# Patient Record
Sex: Female | Born: 1945
Health system: Southern US, Community
[De-identification: ages and names within clinical notes are randomized; demographics above are authoritative.]

## PROBLEM LIST (undated history)

## (undated) DIAGNOSIS — R002 Palpitations: Secondary | ICD-10-CM

## (undated) DIAGNOSIS — H9190 Unspecified hearing loss, unspecified ear: Secondary | ICD-10-CM

## (undated) DIAGNOSIS — R42 Dizziness and giddiness: Secondary | ICD-10-CM

## (undated) DIAGNOSIS — M1990 Unspecified osteoarthritis, unspecified site: Secondary | ICD-10-CM

## (undated) HISTORY — PX: BREAST SURGERY: SHX581

## (undated) HISTORY — DX: Palpitations: R00.2

## (undated) HISTORY — DX: Dizziness and giddiness: R42

## (undated) HISTORY — PX: WRIST SURGERY: SHX841

## (undated) HISTORY — PX: BACK SURGERY: SHX140

## (undated) HISTORY — PX: KNEE ARTHROSCOPY: SUR90

---

## 2001-07-27 ENCOUNTER — Ambulatory Visit (HOSPITAL_COMMUNITY): Admission: RE | Admit: 2001-07-27 | Discharge: 2001-07-27 | Payer: Self-pay | Admitting: Pulmonary Disease

## 2001-08-03 ENCOUNTER — Encounter: Admission: RE | Admit: 2001-08-03 | Discharge: 2001-08-03 | Payer: Self-pay | Admitting: Oncology

## 2001-08-03 ENCOUNTER — Encounter (HOSPITAL_COMMUNITY): Admission: RE | Admit: 2001-08-03 | Discharge: 2001-09-02 | Payer: Self-pay | Admitting: Oncology

## 2002-04-20 ENCOUNTER — Encounter: Admission: RE | Admit: 2002-04-20 | Discharge: 2002-04-20 | Payer: Self-pay | Admitting: Oncology

## 2002-04-20 ENCOUNTER — Encounter (HOSPITAL_COMMUNITY): Admission: RE | Admit: 2002-04-20 | Discharge: 2002-05-20 | Payer: Self-pay | Admitting: Oncology

## 2003-04-21 ENCOUNTER — Encounter (HOSPITAL_COMMUNITY): Admission: RE | Admit: 2003-04-21 | Discharge: 2003-05-21 | Payer: Self-pay | Admitting: Oncology

## 2003-04-21 ENCOUNTER — Encounter: Admission: RE | Admit: 2003-04-21 | Discharge: 2003-04-21 | Payer: Self-pay | Admitting: Oncology

## 2004-05-07 ENCOUNTER — Encounter: Admission: RE | Admit: 2004-05-07 | Discharge: 2004-05-07 | Payer: Self-pay | Admitting: Oncology

## 2004-05-07 ENCOUNTER — Encounter (HOSPITAL_COMMUNITY): Admission: RE | Admit: 2004-05-07 | Discharge: 2004-06-06 | Payer: Self-pay | Admitting: Oncology

## 2005-06-21 ENCOUNTER — Encounter (HOSPITAL_COMMUNITY): Admission: RE | Admit: 2005-06-21 | Discharge: 2005-07-21 | Payer: Self-pay | Admitting: Oncology

## 2005-06-21 ENCOUNTER — Ambulatory Visit (HOSPITAL_COMMUNITY): Payer: Self-pay | Admitting: Oncology

## 2005-06-21 ENCOUNTER — Encounter: Admission: RE | Admit: 2005-06-21 | Discharge: 2005-06-21 | Payer: Self-pay | Admitting: Oncology

## 2006-11-13 ENCOUNTER — Ambulatory Visit (HOSPITAL_COMMUNITY): Admission: RE | Admit: 2006-11-13 | Discharge: 2006-11-13 | Payer: Self-pay | Admitting: Internal Medicine

## 2006-11-13 ENCOUNTER — Ambulatory Visit: Payer: Self-pay | Admitting: Internal Medicine

## 2009-02-16 HISTORY — PX: EYE SURGERY: SHX253

## 2009-03-16 ENCOUNTER — Ambulatory Visit (HOSPITAL_COMMUNITY): Admission: RE | Admit: 2009-03-16 | Discharge: 2009-03-16 | Payer: Self-pay | Admitting: Ophthalmology

## 2010-01-17 ENCOUNTER — Ambulatory Visit (HOSPITAL_COMMUNITY): Admission: RE | Admit: 2010-01-17 | Discharge: 2010-01-17 | Payer: Self-pay | Admitting: Orthopedic Surgery

## 2010-02-08 ENCOUNTER — Ambulatory Visit (HOSPITAL_COMMUNITY): Admission: RE | Admit: 2010-02-08 | Discharge: 2010-02-08 | Payer: Self-pay | Admitting: Pulmonary Disease

## 2010-03-05 ENCOUNTER — Ambulatory Visit: Payer: Self-pay | Admitting: Cardiovascular Disease

## 2010-03-05 DIAGNOSIS — R0602 Shortness of breath: Secondary | ICD-10-CM

## 2010-03-05 DIAGNOSIS — R002 Palpitations: Secondary | ICD-10-CM

## 2010-03-05 DIAGNOSIS — R9431 Abnormal electrocardiogram [ECG] [EKG]: Secondary | ICD-10-CM

## 2010-03-05 DIAGNOSIS — R42 Dizziness and giddiness: Secondary | ICD-10-CM | POA: Insufficient documentation

## 2010-03-06 ENCOUNTER — Encounter: Payer: Self-pay | Admitting: Cardiovascular Disease

## 2010-03-22 ENCOUNTER — Ambulatory Visit: Payer: Self-pay | Admitting: Cardiology

## 2010-03-22 ENCOUNTER — Ambulatory Visit (HOSPITAL_COMMUNITY): Admission: RE | Admit: 2010-03-22 | Discharge: 2010-03-22 | Payer: Self-pay | Admitting: Cardiovascular Disease

## 2010-03-22 ENCOUNTER — Encounter: Payer: Self-pay | Admitting: Cardiovascular Disease

## 2010-03-26 ENCOUNTER — Encounter: Payer: Self-pay | Admitting: Cardiovascular Disease

## 2010-03-27 ENCOUNTER — Encounter: Payer: Self-pay | Admitting: Cardiovascular Disease

## 2010-12-18 NOTE — Letter (Signed)
Summary: chest xray 02-08-10  chest xray 02-08-10   Imported By: Faythe Ghee 03/06/2010 11:58:19  _____________________________________________________________________  External Attachment:    Type:   Image     Comment:   External Document

## 2010-12-18 NOTE — Letter (Signed)
Summary: progress notes dr Juanetta Gosling  progress notes dr Juanetta Gosling   Imported By: Faythe Ghee 03/06/2010 11:57:04  _____________________________________________________________________  External Attachment:    Type:   Image     Comment:   External Document

## 2010-12-18 NOTE — Letter (Signed)
Summary: labs dr Juanetta Gosling  labs dr Juanetta Gosling   Imported By: Faythe Ghee 03/06/2010 11:57:26  _____________________________________________________________________  External Attachment:    Type:   Image     Comment:   External Document

## 2010-12-18 NOTE — Letter (Signed)
Summary: [place] Results Engineer, agricultural at [facility]  [address] [place] [address]   Phone: [phone]  Fax: [phone]      Mar 27, 2010 MRN: [id number]   Vickie Love [address] [place], [place]  [address]   Dear [name],  Your test ordered by Selena Batten has been reviewed by your physician (or physician assistant) and was found to be normal or stable. Your physician (or physician assistant) felt no changes were needed at this time.  ____ Echocardiogram  __X__ Cardiac Stress Test  ____ Lab Work  ____ Peripheral vascular study of arms, legs or neck  ____ CT scan or X-ray  ____ Lung or Breathing test  ____ Other: Please continue on current medical treatment.   Thank you.   Charlton Haws, MD, F.A.C.C

## 2010-12-18 NOTE — Letter (Signed)
Summary: Stress Echocardiogram Information Sheet  [facility]  [address], [place] [address]   Phone: [phone]  Fax: [phone]      March 05, 2010 MRN: [id number] light prior to the test.   Vickie Love  Doctor: Appointment Date: Appointment Time: Appointment Location: [facility]  Stress Echocardiogram Information Sheet    Instructions:   1. DO NOT  take your medicine  days before the test. (it is ok to take medications that morning).  2. Do not eat after midnight the night before Stress Echo is performed.  3. Dress prepared to exercise.  4. DO NOT use ANY caffine or tobacco products 3 hours before appointment.  5. Report to the Short Stay Center on the1st floor.  6. Please bring all current prescription medications.  7. If you have any questions, please call [phone]

## 2010-12-18 NOTE — Assessment & Plan Note (Signed)
Summary: **NP3 DIZZYNESS FAM HX OF STROKE AND HEART DISEASE   Visit Type:  Initial Consult Primary Provider:  Nehemiah Settle  CC:  occasional dizziness.  History of Present Illness: Vickie Love is referred by Dr Juanetta Gosling for exertional dyspnea, abnormal ECG and dizzyness.  She is a healthy female in general and still works at [employer].  He dyspnea is mild with walking up hills but in general she is very active with no cough, sputum, wheezing or SSCP.  She has had chronic bouts of dizzyness and recent CT showed a benign meningioma.  These episodes are improved and not associated with diaphoresis, nausea, vohmiting, migraines or palpitaiotns.  Her ECG shows a RBBB but no previous M.I. She is concerned about her heart since she hopes to retire in a year and "enjoy" herself  Current Problems (verified): 1)  Dyspnea  (ICD-786.05) 2)  Palpitations  (ICD-785.1) 3)  Dizziness  (ICD-780.4)  Current Medications (verified): 1)  Restoril 30 Mg Caps (Temazepam) .... Take 1 Tab Qhs  Allergies (verified): No Known Drug Allergies  Past History:  Past Medical History: Last updated: 02/26/2010 Current Problems:  PALPITATIONS (ICD-785.1) DIZZINESS (ICD-780.4)  Family History: Last updated: 02/26/2010 Father:hx of stroke  Social History: Married Works for [employer] 3 daughters Nonsmoker Nondrinker  Review of Systems       Denies fever, malais, weight loss, blurry vision, decreased visual acuity, cough, sputum, , hemoptysis, pleuritic pain, palpitaitons, heartburn, abdominal pain, melena, lower extremity edema, claudication, or rash.   Vital Signs:  Patient profile:   65 year old female Height:      66 inches Weight:      134 pounds BMI:     21.71 Pulse rate:   81 / minute BP sitting:   133 / 81  (right arm)  Vitals Entered By: Dreama Saa, CNA (March 05, 2010 10:09 AM)  Physical Exam  General:  Affect appropriate Healthy:  appears stated age HEENT: normal Neck supple with no  adenopathy JVP normal no bruits no thyromegaly Lungs clear with no wheezing and good diaphragmatic motion Heart:  S1/S2 no murmur,rub, gallop or click PMI normal Abdomen: benighn, BS positve, no tenderness, no AAA no bruit.  No HSM or HJR Distal pulses intact with no bruits No edema Neuro non-focal Skin warm and dry    Impression & Recommendations:  Problem # 1:  DYSPNEA (ICD-786.05) Normal exam.  Stress echo Orders: Stress Echo (Stress Echo)  Problem # 2:  DIZZINESS (ICD-780.4) Non cardiac  F/U Hawkins  ? vertigo Orders: Stress Echo (Stress Echo)  Problem # 3:  ELECTROCARDIOGRAM, ABNORMAL (ICD-794.31) Benign ICRBBB.  F/U stress echo  Patient Instructions: 1)  Your physician recommends that you schedule a follow-up appointment in: as needed  2)  Your physician recommends that you continue on your current medications as directed. Please refer to the Current Medication list given to you today. 3)  Your physician has requested that you have a stress echocardiogram. For further information please visit [email or web address]  Please follow instruction sheet as given.   EKG Report  Procedure date:  03/05/2010  Findings:      NSR 71 ICRBBB Borderline ECG

## 2010-12-18 NOTE — Letter (Signed)
Summary: ct head 3.2.11  ct head 3.2.11   Imported By: Faythe Ghee 03/06/2010 11:57:50  _____________________________________________________________________  External Attachment:    Type:   Image     Comment:   External Document

## 2010-12-18 NOTE — Letter (Signed)
Summary: [place] Results Engineer, agricultural at [facility]  [address], [place] [address]   Phone: [phone]  Fax: [phone]      Mar 26, 2010 MRN: [id number]   DHWANI VENKATESH [address] [place], [place]  [address]   Dear Ms. Weatherwax,  Your test ordered by Selena Batten has been reviewed by your physician (or physician assistant) and was found to be normal or stable. Your physician (or physician assistant) felt no changes were needed at this time.  ____ Echocardiogram  ____ Cardiac Stress Test  ____ Lab Work  ____ Peripheral vascular study of arms, legs or neck  ____ CT scan or X-ray  ____ Lung or Breathing test  __X__ Other: Stress Echo Please continue on current medical treatment.  Thank you.   Charlton Haws, MD, F.A.C.C

## 2011-01-11 ENCOUNTER — Other Ambulatory Visit (HOSPITAL_COMMUNITY): Payer: Self-pay | Admitting: Pulmonary Disease

## 2011-01-11 DIAGNOSIS — D1800 Hemangioma unspecified site: Secondary | ICD-10-CM

## 2011-01-14 ENCOUNTER — Ambulatory Visit (HOSPITAL_COMMUNITY)
Admission: RE | Admit: 2011-01-14 | Discharge: 2011-01-14 | Payer: 59 | Source: Ambulatory Visit | Attending: Pulmonary Disease | Admitting: Pulmonary Disease

## 2011-01-23 ENCOUNTER — Other Ambulatory Visit (HOSPITAL_COMMUNITY): Payer: Self-pay | Admitting: Pulmonary Disease

## 2011-01-23 DIAGNOSIS — D1800 Hemangioma unspecified site: Secondary | ICD-10-CM

## 2011-01-28 ENCOUNTER — Ambulatory Visit (HOSPITAL_COMMUNITY)
Admission: RE | Admit: 2011-01-28 | Discharge: 2011-01-28 | Disposition: A | Discharge status: Home / Self Care | Payer: 59 | Source: Ambulatory Visit | Attending: Pulmonary Disease | Admitting: Pulmonary Disease

## 2011-01-28 ENCOUNTER — Encounter (HOSPITAL_COMMUNITY): Payer: Self-pay

## 2011-01-28 DIAGNOSIS — R930 Abnormal findings on diagnostic imaging of skull and head, not elsewhere classified: Secondary | ICD-10-CM | POA: Insufficient documentation

## 2011-01-28 DIAGNOSIS — M899 Disorder of bone, unspecified: Secondary | ICD-10-CM | POA: Insufficient documentation

## 2011-01-28 DIAGNOSIS — D1800 Hemangioma unspecified site: Secondary | ICD-10-CM

## 2011-02-27 LAB — HEMOGLOBIN AND HEMATOCRIT, BLOOD: Hemoglobin: 14.3 g/dL (ref 12.0–15.0)

## 2011-02-27 LAB — BASIC METABOLIC PANEL
Calcium: 9.8 mg/dL (ref 8.4–10.5)
GFR calc Af Amer: >60 mL/min (ref >60)
GFR calc non Af Amer: >60 mL/min (ref >60)
Sodium: 140 mEq/L (ref 135–145)

## 2011-04-05 NOTE — Op Note (Signed)
NAMELIVIAN, Vickie Love                  ACCOUNT NO.:  [id number]   MEDICAL RECORD NO.:  [id number]          PATIENT TYPE:  AMB   LOCATION:  DAY                           FACILITY:  [facility]   PHYSICIAN:  R. Roetta Sessions, M.D. DATE OF BIRTH:  Apr 14, 1946   DATE OF PROCEDURE:  11/13/2006  DATE OF DISCHARGE:                               OPERATIVE REPORT   PROCEDURE PERFORMED:  Screening colonoscopy.   INDICATIONS FOR PROCEDURE:  The patient is a  65 year old lady referred  courtesy of [name], M.D. for colorectal cancer screening.  She  is devoid of any lower GI tract symptoms.  She had a sigmoidoscopy,  unremarkable sigmoidoscopy in 1998 and there is no family history of any  first degree relatives with colorectal cancer.  Colonoscopy is now being  done as a screening maneuver.  This approach has been discussed with the  patient at length.  Potential risks, benefits and alternatives have been  reviewed, questions have been answered.  Please see documentation in the  medical records.   PROCEDURE NOTE:  Oxygen saturations, blood pressure, pulse and  respirations were monitored throughout the entire procedure.   CONSCIOUS SEDATION:  Versed 4 mg IV, Demerol 75 mg IV in divided doses.   INSTRUMENT USED:  Pentax video chip system.   FINDINGS:  Digital exam revealed no abnormalities.   ENDOSCOPIC FINDINGS:  Prep was good.  Colonoscope was advanced from the  rectosigmoid junction to the left, transverse and right colon to the  area of the appendiceal orifice and ileocecal valve and cecum.  These  structures were well seen and photographed for the record.  From this  level, the scope was slowly and cautiously withdrawn.  All previously  mentioned mucosal surfaces were again seen.  The colonic mucosa appeared  normal.  The scope was pulled down into the rectum where a thorough  examination of the rectal mucosa including retroflexion of the anal  verge was undertaken.  The rectal mucosa  appeared normal.  The patient  tolerated the procedure well, was reacted in endoscopy.   IMPRESSION:  Normal rectum, normal colon.   RECOMMENDATIONS:  Repeat colonoscopy in 10 years.      Jonathon Bellows, M.D.  Electronically Signed     RMR/MEDQ  D:  11/13/2006  T:  11/13/2006  Job:  [id number]   cc:   Ramon Dredge L. Juanetta Gosling, M.D.  Fax: [phone]   [name]  Fax: [phone]

## 2011-04-23 IMAGING — CT CT HEAD W/O CM
1 series · 16 of 30 positions shown, 20 images · non-contrast
Comparison: 01/17/2010

CLINICAL DATA: Follow-up occipital skull lesion

CT HEAD WITHOUT CONTRAST
TECHNIQUE: Contiguous axial images were obtained from the base of
the skull through the vertex without contrast.

[Series 2: headseq 4.8 h37s · axial · 0.43mm/px · z∈[+151,+309]mm · 16 of 36 slices shown, 20 images]
[im 2/36  brain]
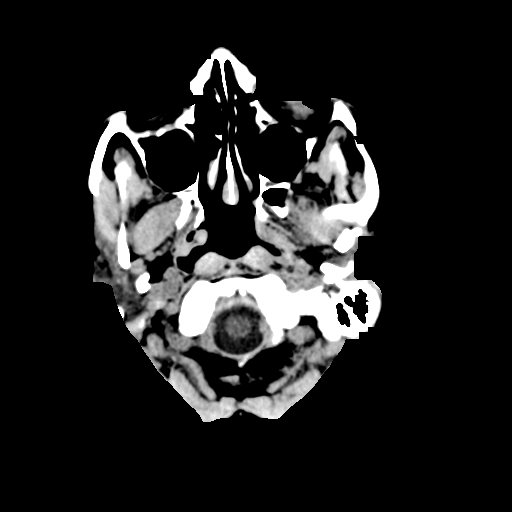
[im 2/36  bone]
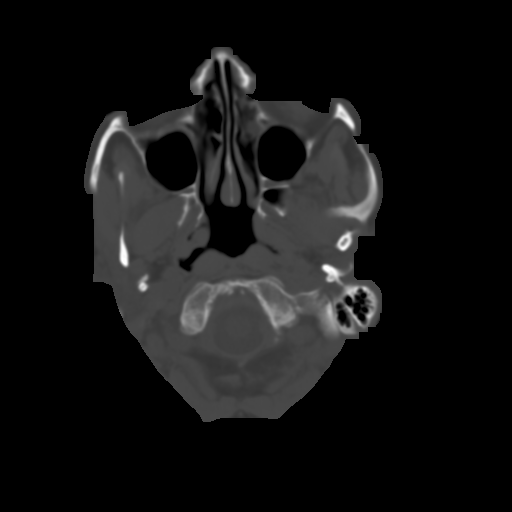
[im 4/36  brain]
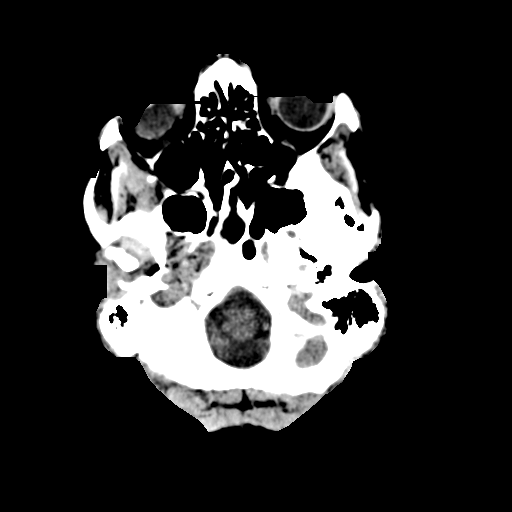
[im 7/36  brain]
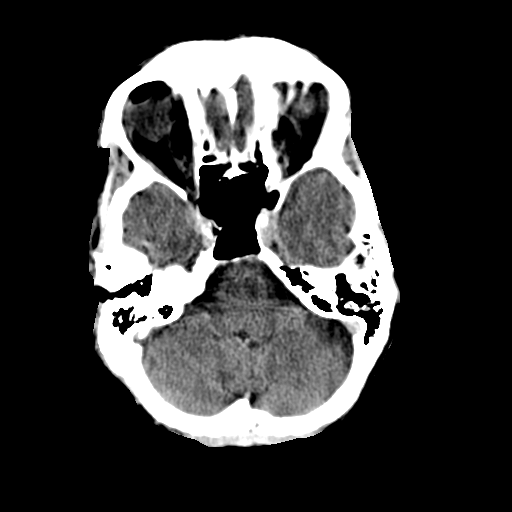
[im 9/36  brain]
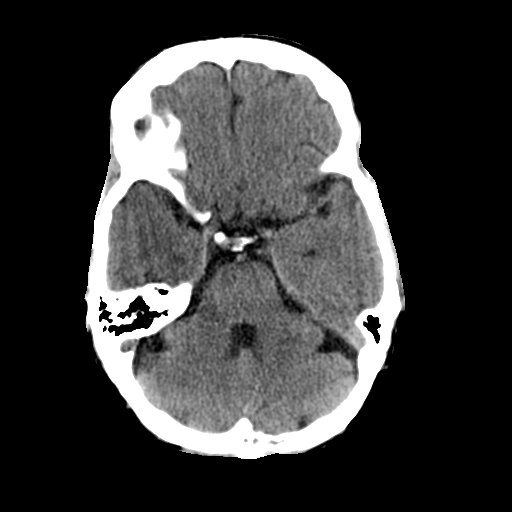
[im 10/36  brain]
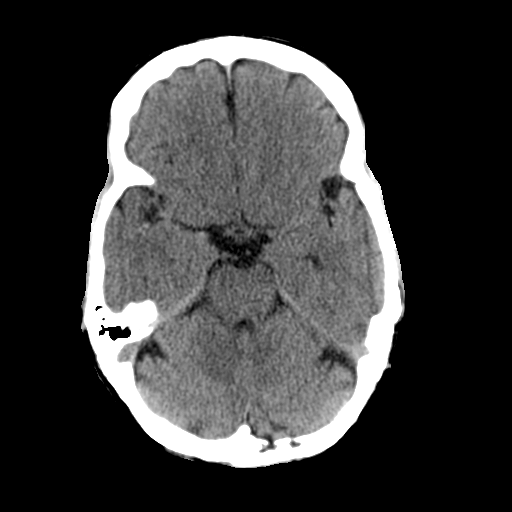
[im 10/36  bone]
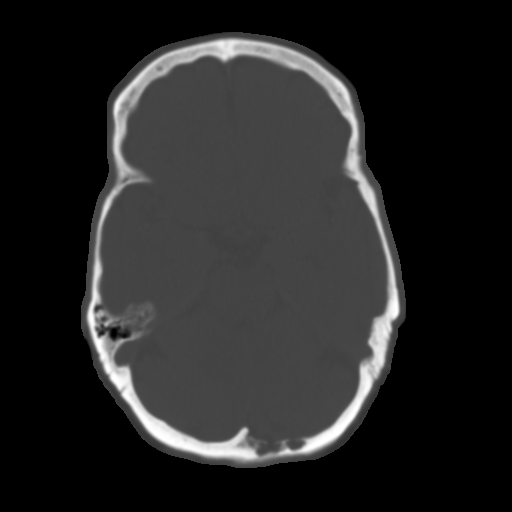
[im 13/36  brain]
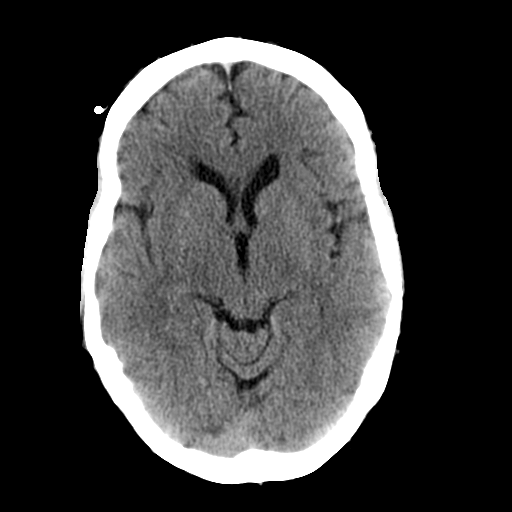
[im 15/36  brain]
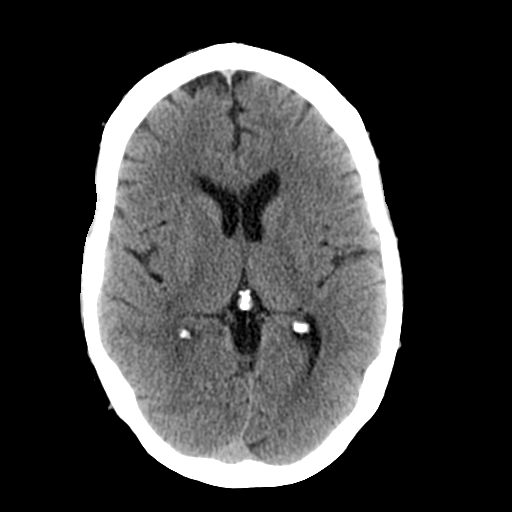
[im 17/36  brain]
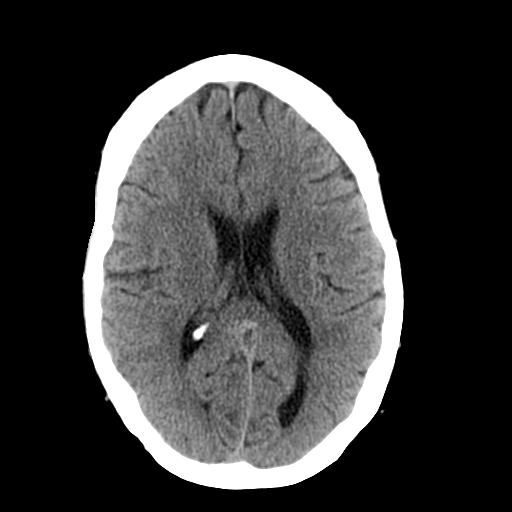
[im 19/36  brain]
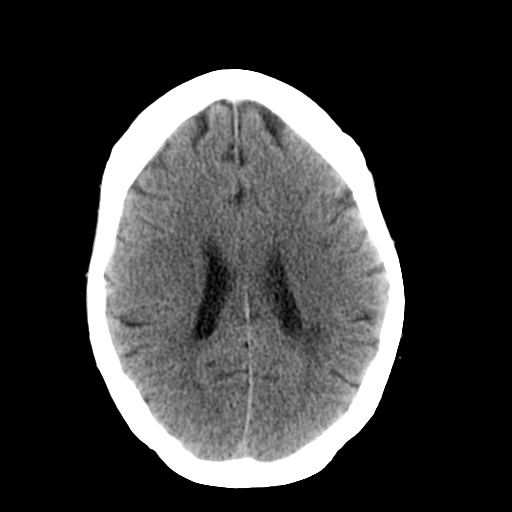
[im 19/36  bone]
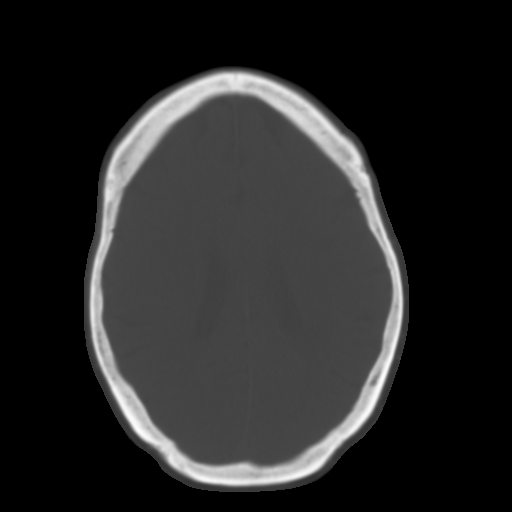
[im 21/36  brain]
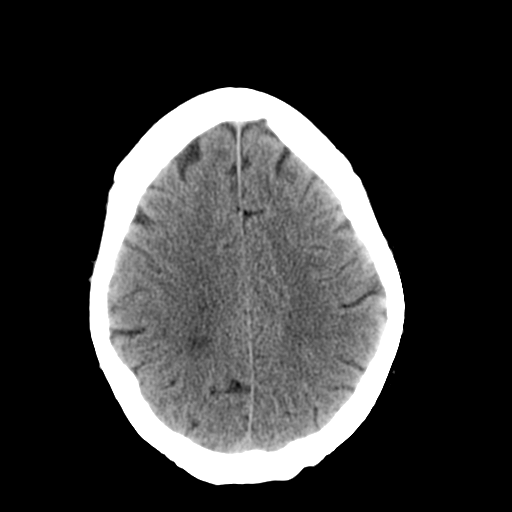
[im 23/36  brain]
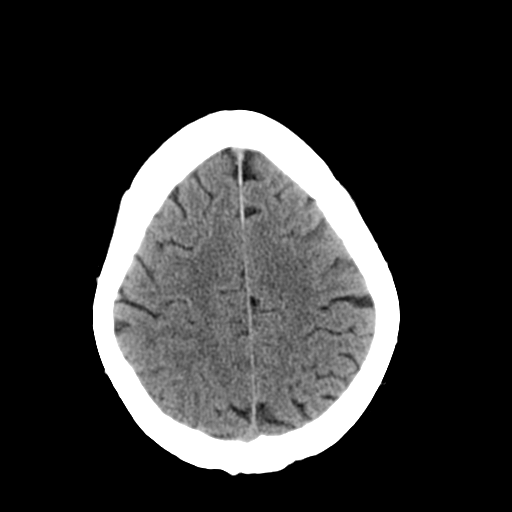
[im 26/36  brain]
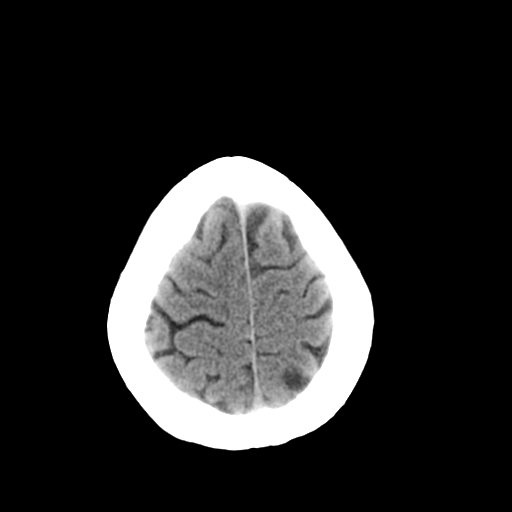
[im 27/36  brain]
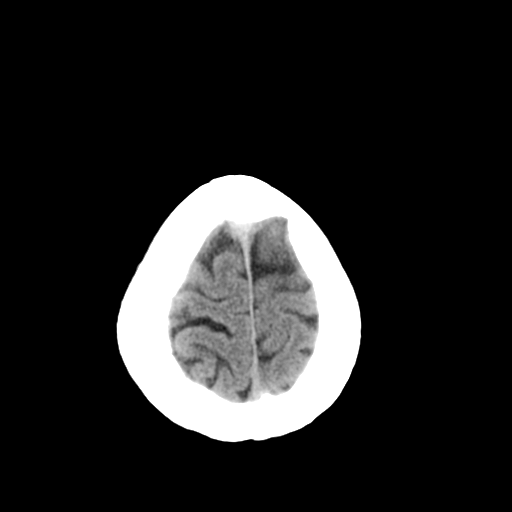
[im 27/36  bone]
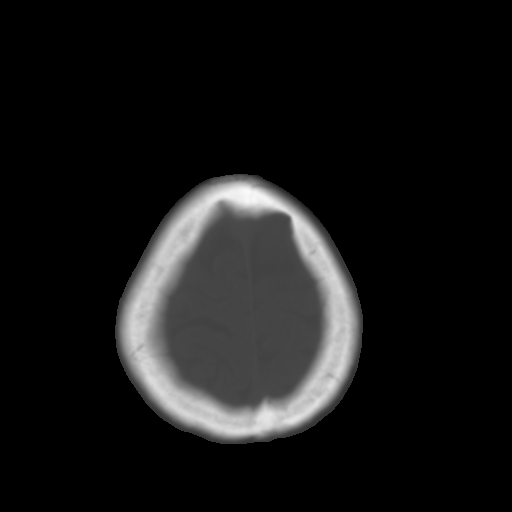
[im 29/36  brain]
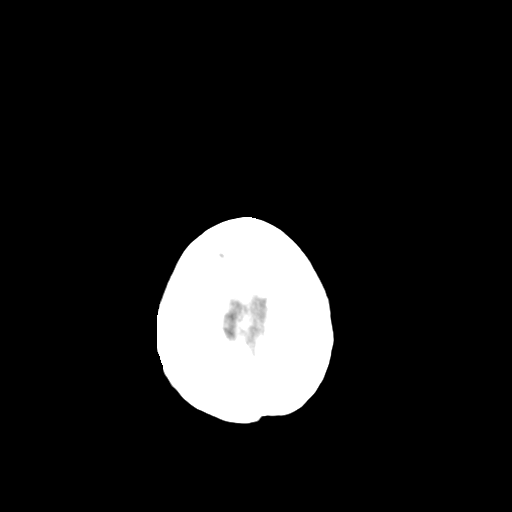
[im 32/36  brain]
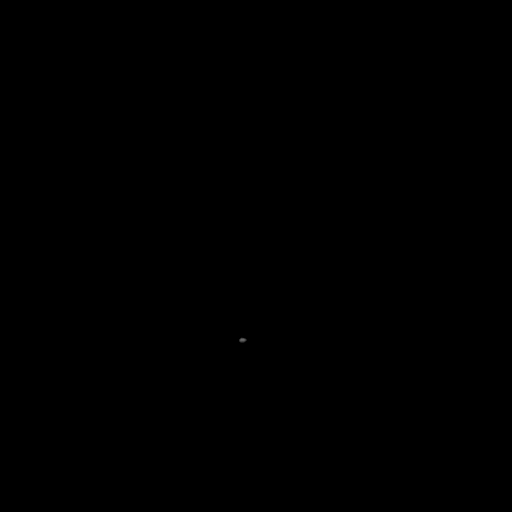
[im 34/36  brain]
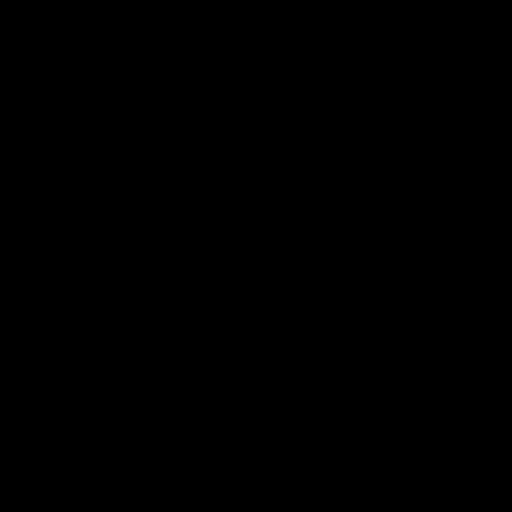

[16 of 30 positions shown; findings below may reference images not displayed]

FINDINGS: Ventricular size and CSF spaces remain normal.  Mild
chronic microvascular changes again noted.  No evidence for acute
infarct, hemorrhage, or mass lesion. No extra-axial fluid
collections or midline shift.  Calvarium intact.  No fluid in the
sinuses visualized.

The lytic lesion in the left occipital bone is unchanged in size or
contour compared to the study 1 year ago.  It is therefore highly
likely that this is a benign lesion of bone such as a hemangioma.
There are no new findings.
IMPRESSION: 1.  Lytic lesion in the left occipital bone stable over a period of
1 year.  Highly likely to be benign.
2.  No acute or focal intracranial abnormality.  Again noted are
mild chronic microvascular white matter changes.
3.  No new findings.

## 2011-07-16 ENCOUNTER — Ambulatory Visit (HOSPITAL_COMMUNITY)
Admission: RE | Admit: 2011-07-16 | Discharge: 2011-07-16 | Disposition: A | Discharge status: Home / Self Care | Payer: 59 | Source: Ambulatory Visit | Attending: Pulmonary Disease | Admitting: Pulmonary Disease

## 2011-07-16 ENCOUNTER — Other Ambulatory Visit (HOSPITAL_COMMUNITY): Payer: Self-pay | Admitting: Pulmonary Disease

## 2011-07-16 DIAGNOSIS — R05 Cough: Secondary | ICD-10-CM

## 2011-07-16 DIAGNOSIS — J40 Bronchitis, not specified as acute or chronic: Secondary | ICD-10-CM | POA: Insufficient documentation

## 2011-07-16 DIAGNOSIS — R059 Cough, unspecified: Secondary | ICD-10-CM | POA: Insufficient documentation

## 2011-10-18 ENCOUNTER — Encounter (HOSPITAL_COMMUNITY): Payer: Self-pay | Admitting: Pharmacy Technician

## 2011-10-21 NOTE — Patient Instructions (Signed)
20 Vickie Love  10/21/2011   Your procedure is scheduled on:  10/28/2011  Report to [facility] at 10:00 AM.  Call this number if you have problems the morning of surgery: [phone]   Remember:   Do not eat food:After Midnight.  May have clear liquids:until Midnight .  Clear liquids include soda, tea, black coffee, apple or grape juice, broth.  Take these medicines the morning of surgery with A SIP OF WATER:    Do not wear jewelry, make-up or nail polish.  Do not wear lotions, powders, or perfumes. You may wear deodorant.  Do not shave 48 hours prior to surgery.  Do not bring valuables to the hospital.  Contacts, dentures or bridgework may not be worn into surgery.  Leave suitcase in the car. After surgery it may be brought to your room.  For patients admitted to the hospital, checkout time is 11:00 AM the day of discharge.   Patients discharged the day of surgery will not be allowed to drive home.  Name and phone number of your driver:   Special Instructions: N/A   Please read over the following fact sheets that you were given: Anesthesia Post-op Instructions     Cataract A cataract is a clouding of the lens of the eye. It is most often related to aging. A cataract is not a "film" over the surface of the eye. The lens is inside the eye and changes size of the pupil. The lens can enlarge to let more light enter the eye in dark environments and contract the size of the pupil to let in bright light. The lens is the part of the eye that helps focus light on the retina. The retina is the eye's light-sensitive layer. It is in the back of the eye that sends visual signals to the brain. In a normal eye, light passes through the lens and gets focused on the retina. To help produce a sharp image, the lens must remain clear. When a lens becomes cloudy, vision is compromised by the degree and nature of the clouding. Certain cataracts make people more near-sighted as they develop, others increase  glare, and all reduce vision to some degree or another. A cataract that is so dense that it becomes milky Vickie Love and a Vickie Love opacity can be seen through the pupil. When the Vickie Love color is seen, it is called a "mature" or "hyper-mature cataract." Such cataracts cause total blindness in the affected eye. The cataract must be removed to prevent damage to the eye itself. Some types of cataracts can cause a secondary disease of the eye, such as certain types of glaucoma. In the early stages, better lighting and eyeglasses may lessen vision problems caused by cataracts. At a certain point, surgery may be needed to improve vision. CAUSES   Aging. However, cataracts may occur at any age, even in newborns.   Certain drugs.   Trauma to the eye.   Certain diseases (such as diabetes).   Inherited or acquired medical syndromes.  SYMPTOMS   Gradual, progressive drop in vision in the affected eye. Cataracts may develop at different rates in each eye. Cataracts may even be in just one eye with the other unaffected.   Cataracts due to trauma may develop quickly, sometimes over a matter or days or even hours. The result is severe and rapid visual loss.  DIAGNOSIS  To detect a cataract, an eye doctor examines the lens. A well developed cataract can be diagnosed without dilating the  pupil. Early cataracts and others of a specific nature are best diagnosed with an exam of the eyes with the pupils dilated by drops. TREATMENT   For an early cataract, vision may improve by using different eyeglasses or stronger lighting.   If the above measures do not help, surgery is the only effective treatment. This treatment removes the cloudy lens and replaces it with a substitute lens (Intraocular lens, or IOL). Newly developed IOL technology allows the implanted lens to improve vision both at a distance and up close. Discuss with your eye surgeon about the possibility of still needing glasses. Also discuss how visual  coordination between both eyes will be affected.  A cataract needs to be removed only when vision loss interferes with your everyday activities such as driving, reading or watching TV. You and your eye doctor can make that decision together. In most cases, waiting until you are ready to have cataract surgery will not harm your eye. If you have cataracts in both eyes, only one should be removed at a time. This allows the operated eye to heal and be out of danger from serious problems (such as infection or poor wound healing) before having the other eye undergo surgery.  Sometimes, a cataract should be removed even if it does not cause problems with your vision. For example, a cataract should be removed if it prevents examination or treatment of another eye problem. Just as you cannot see out of the affected eye well, your doctor cannot see into your eye well through a cataract. The vast majority of people who have cataract surgery have better vision afterward. CATARACT REMOVAL There are two primary ways to remove a cataract. Your doctor can explain the differences and help determine which is best for you:  Phacoemulsification (small incision cataract surgery). This involves making a small cut (incision) on the edge of the clear, dome-shaped surface that covers the front of the eye (the cornea). An injection behind the eye or eye drops are given to make this a painless procedure. The doctor then inserts a tiny probe into the eye. This device emits ultrasound waves that soften and break up the cloudy center of the lens so it can be removed by suction. Most cataract surgery is done this way. The cuts are usually so small and performed in such a manner that often no sutures are needed to keep it closed.   Extracapsular surgery. Your doctor makes a slightly longer incision on the side of the cornea. The doctor removes the hard center of the lens. The remainder of the lens is then removed by suction. In some  cases, extremely fine sutures are needed which the doctor may, or may not remove in the office after the surgery.  When an IOL is implanted, it needs no care. It becomes a permanent part of your eye and cannot be seen or felt.  Some people cannot have an IOL. They may have problems during surgery, or maybe they have another eye disease. For these people, a soft contact lens may be suggested. If an IOL or contact lens cannot be used, very powerful and thick glasses are required after surgery. Since vision is very different through such thick glasses, it is important to have your doctor discuss the impact on your vision after any cataract surgery where there is no plan to implant an IOL. The normal lens of the eye is covered by a clear capsule. Both phacoemulsification and extracapsular surgery require that the back surface of  this lens capsule be left in place. This helps support IOLs and prevents the IOL from dislocating and falling back into the deeper interior of the eye. Right after surgery, and often permanently this "posterior capsule" remains clear. In some cases however, it can become cloudy, presenting the same type of visual compromise that the original cataract did since light is again obstructed as it passes through the clear IOL. This condition is often referred to as an "after-cataract." Fortunately, after-cataracts are easily treated using a painless and very fast laser treatment that is performed without anesthesia or incisions. It is done in a matter of minutes in an outpatient environment. Visual improvement is often immediate.  HOME CARE INSTRUCTIONS   Your surgeon will discuss pre and post operative care with you prior to surgery. The majority of people are able to do almost all normal activities right away. Although, it is often advised to avoid strenuous activity for a period of time.   Postoperative drops and careful avoidance of infection will be needed. Many surgeons suggest the use  of a protective shield during the first few days after surgery.   There is a very small incidence of complication from modern cataract surgery, but it can happen. Infection that spreads to the inside of the eye (endophthalmitis) can result in total visual loss and even loss of the eye itself. In extremely rare instances, the inflammation of endophthalmitis can spread to both eyes (sympathetic ophthalmia). Appropriate post-operative care under the close observation of your surgeon is essential to a successful outcome.  SEEK IMMEDIATE MEDICAL CARE IF:   You have any sudden drop of vision in the operated eye.   You have pain in the operated eye.   You see a large number of floating dots in the field of vision in the operated eye.   You see flashing lights, or if a portion of your side vision in any direction appears black (like a curtain being drawn into your field of vision) in the operated eye.  Document Released: 11/04/2005 Document Revised: 07/17/2011 Document Reviewed: 12/21/2007 [facility] Patient Information 2012 [place], [place].   PATIENT INSTRUCTIONS POST-ANESTHESIA  IMMEDIATELY FOLLOWING SURGERY:  Do not drive or operate machinery for the first twenty four hours after surgery.  Do not make any important decisions for twenty four hours after surgery or while taking narcotic pain medications or sedatives.  If you develop intractable nausea and vomiting or a severe headache please notify your doctor immediately.  FOLLOW-UP:  Please make an appointment with your surgeon as instructed. You do not need to follow up with anesthesia unless specifically instructed to do so.  WOUND CARE INSTRUCTIONS (if applicable):  Keep a dry clean dressing on the anesthesia/puncture wound site if there is drainage.  Once the wound has quit draining you may leave it open to air.  Generally you should leave the bandage intact for twenty four hours unless there is drainage.  If the epidural site drains for more  than 36-48 hours please call the anesthesia department.  QUESTIONS?:  Please feel free to call your physician or the hospital operator if you have any questions, and they will be happy to assist you.     [facility] Anesthesia Department [address] [place] [phone]

## 2011-10-22 ENCOUNTER — Encounter (HOSPITAL_COMMUNITY): Payer: Self-pay

## 2011-10-22 ENCOUNTER — Other Ambulatory Visit: Payer: Self-pay

## 2011-10-22 ENCOUNTER — Encounter (HOSPITAL_COMMUNITY)
Admission: RE | Admit: 2011-10-22 | Discharge: 2011-10-22 | Disposition: A | Discharge status: Home / Self Care | Payer: 59 | Source: Ambulatory Visit | Attending: Ophthalmology | Admitting: Ophthalmology

## 2011-10-22 HISTORY — DX: Unspecified osteoarthritis, unspecified site: M19.90

## 2011-10-22 HISTORY — DX: Unspecified hearing loss, unspecified ear: H91.90

## 2011-10-22 LAB — CBC
HCT: 39.6 % (ref 36.0–46.0)
Hemoglobin: 13.3 g/dL (ref 12.0–15.0)
MCH: 30.9 pg (ref 26.0–34.0)
MCHC: 33.6 g/dL (ref 30.0–36.0)
MCV: 91.9 fL (ref 78.0–100.0)
Platelets: 157 10*3/uL (ref 150–400)
RBC: 4.31 MIL/uL (ref 3.87–5.11)
RDW: 12.8 % (ref 11.5–15.5)
WBC: 4 10*3/uL (ref 4.0–10.5)

## 2011-10-22 LAB — BASIC METABOLIC PANEL
Calcium: 9.8 mg/dL (ref 8.4–10.5)
Chloride: 102 mEq/L (ref 96–112)
GFR calc Af Amer: >90 mL/min (ref >90)
Glucose, Bld: 103 mg/dL — ABNORMAL HIGH (ref 70–99)
Sodium: 140 mEq/L (ref 135–145)

## 2011-10-28 ENCOUNTER — Encounter (HOSPITAL_COMMUNITY)
Admission: RE | Disposition: A | Discharge status: Home / Self Care | Payer: Self-pay | Source: Ambulatory Visit | Attending: Ophthalmology

## 2011-10-28 ENCOUNTER — Encounter (HOSPITAL_COMMUNITY): Payer: Self-pay | Admitting: *Deleted

## 2011-10-28 ENCOUNTER — Encounter (HOSPITAL_COMMUNITY): Payer: Self-pay | Admitting: Anesthesiology

## 2011-10-28 ENCOUNTER — Ambulatory Visit (HOSPITAL_COMMUNITY)
Admission: RE | Admit: 2011-10-28 | Discharge: 2011-10-28 | Disposition: A | Discharge status: Home / Self Care | Payer: 59 | Source: Ambulatory Visit | Attending: Ophthalmology | Admitting: Ophthalmology

## 2011-10-28 DIAGNOSIS — Z01812 Encounter for preprocedural laboratory examination: Secondary | ICD-10-CM | POA: Insufficient documentation

## 2011-10-28 DIAGNOSIS — H2589 Other age-related cataract: Secondary | ICD-10-CM | POA: Insufficient documentation

## 2011-10-28 DIAGNOSIS — Z01810 Encounter for preprocedural cardiovascular examination: Secondary | ICD-10-CM | POA: Insufficient documentation

## 2011-10-28 HISTORY — PX: CATARACT EXTRACTION W/PHACO: SHX586

## 2011-10-28 SURGERY — PHACOEMULSIFICATION, CATARACT, WITH IOL INSERTION
Anesthesia: Monitor Anesthesia Care | Site: Eye | Laterality: Right | Wound class: Clean

## 2011-10-28 MED ORDER — TETRACAINE HCL 0.5 % OP SOLN
1.0000 [drp] | OPHTHALMIC | Status: AC
  Administered 2011-10-28 (×3): 1 [drp] via OPHTHALMIC

## 2011-10-28 MED ORDER — MIDAZOLAM HCL 2 MG/2ML IJ SOLN
1.0000 mg | INTRAMUSCULAR | Status: AC | PRN
  Administered 2011-10-28 (×3): 2 mg via INTRAVENOUS

## 2011-10-28 MED ORDER — LACTATED RINGERS IV SOLN: INTRAVENOUS | Status: DC | PRN

## 2011-10-28 MED ORDER — EPINEPHRINE HCL 1 MG/ML IJ SOLN
INTRAMUSCULAR | Status: AC
  Filled 2011-10-28: qty 1

## 2011-10-28 MED ORDER — LIDOCAINE HCL 3.5 % OP GEL
1.0000 "application " | Freq: Once | OPHTHALMIC | Status: AC
  Administered 2011-10-28: 1 via OPHTHALMIC

## 2011-10-28 MED ORDER — EPINEPHRINE HCL 1 MG/ML IJ SOLN
INTRAOCULAR | Status: DC | PRN
  Administered 2011-10-28: 14:00:00

## 2011-10-28 MED ORDER — PHENYLEPHRINE HCL 2.5 % OP SOLN
OPHTHALMIC | Status: AC
  Administered 2011-10-28: 1 [drp] via OPHTHALMIC
  Filled 2011-10-28: qty 2

## 2011-10-28 MED ORDER — MIDAZOLAM HCL 2 MG/2ML IJ SOLN
INTRAMUSCULAR | Status: AC
  Filled 2011-10-28: qty 2

## 2011-10-28 MED ORDER — LIDOCAINE 3.5 % OP GEL OPTIME - NO CHARGE
OPHTHALMIC | Status: DC | PRN
Start: 2011-10-28 — End: 2011-10-28
  Administered 2011-10-28: 1 [drp] via OPHTHALMIC

## 2011-10-28 MED ORDER — CYCLOPENTOLATE-PHENYLEPHRINE 0.2-1 % OP SOLN
OPHTHALMIC | Status: AC
  Administered 2011-10-28: 1 [drp] via OPHTHALMIC
  Filled 2011-10-28: qty 2

## 2011-10-28 MED ORDER — PROVISC 10 MG/ML IO SOLN
INTRAOCULAR | Status: DC | PRN
  Administered 2011-10-28: 8.5 mg via INTRAOCULAR

## 2011-10-28 MED ORDER — NEOMYCIN-POLYMYXIN-DEXAMETH 3.5-10000-0.1 OP OINT
TOPICAL_OINTMENT | OPHTHALMIC | Status: AC
  Filled 2011-10-28: qty 3.5

## 2011-10-28 MED ORDER — LACTATED RINGERS IV SOLN
INTRAVENOUS | Status: DC
  Administered 2011-10-28: 13:00:00 via INTRAVENOUS

## 2011-10-28 MED ORDER — CYCLOPENTOLATE-PHENYLEPHRINE 0.2-1 % OP SOLN
1.0000 [drp] | OPHTHALMIC | Status: AC
  Administered 2011-10-28 (×3): 1 [drp] via OPHTHALMIC

## 2011-10-28 MED ORDER — MIDAZOLAM HCL 5 MG/5ML IJ SOLN
INTRAMUSCULAR | Status: DC | PRN
  Administered 2011-10-28: 2 mg via INTRAVENOUS

## 2011-10-28 MED ORDER — LIDOCAINE HCL (PF) 1 % IJ SOLN
INTRAMUSCULAR | Status: DC | PRN
  Administered 2011-10-28: .5 mL

## 2011-10-28 MED ORDER — POVIDONE-IODINE 5 % OP SOLN
OPHTHALMIC | Status: DC | PRN
  Administered 2011-10-28: 1 via OPHTHALMIC

## 2011-10-28 MED ORDER — TETRACAINE HCL 0.5 % OP SOLN
OPHTHALMIC | Status: AC
  Administered 2011-10-28: 1 [drp] via OPHTHALMIC
  Filled 2011-10-28: qty 2

## 2011-10-28 MED ORDER — BSS IO SOLN
INTRAOCULAR | Status: DC | PRN
  Administered 2011-10-28: 15 mL via INTRAOCULAR

## 2011-10-28 MED ORDER — LIDOCAINE HCL 3.5 % OP GEL
OPHTHALMIC | Status: AC
  Administered 2011-10-28: 1 via OPHTHALMIC
  Filled 2011-10-28: qty 5

## 2011-10-28 MED ORDER — LIDOCAINE HCL (PF) 1 % IJ SOLN
INTRAMUSCULAR | Status: AC
  Filled 2011-10-28: qty 2

## 2011-10-28 MED ORDER — MIDAZOLAM HCL 2 MG/2ML IJ SOLN
INTRAMUSCULAR | Status: AC
  Administered 2011-10-28: 2 mg via INTRAVENOUS
  Filled 2011-10-28: qty 2

## 2011-10-28 MED ORDER — PHENYLEPHRINE HCL 2.5 % OP SOLN
1.0000 [drp] | OPHTHALMIC | Status: AC
  Administered 2011-10-28 (×3): 1 [drp] via OPHTHALMIC

## 2011-10-28 MED ORDER — NEOMYCIN-POLYMYXIN-DEXAMETH 0.1 % OP OINT
TOPICAL_OINTMENT | OPHTHALMIC | Status: DC | PRN
  Administered 2011-10-28: 1 via OPHTHALMIC

## 2011-10-28 SURGICAL SUPPLY — 32 items
CAPSULAR TENSION RING-AMO (OPHTHALMIC RELATED) IMPLANT
CLOTH BEACON ORANGE TIMEOUT ST (SAFETY) ×2 IMPLANT
EYE SHIELD UNIVERSAL CLEAR (GAUZE/BANDAGES/DRESSINGS) ×2 IMPLANT
GLOVE BIO SURGEON STRL SZ 6.5 (GLOVE) IMPLANT
GLOVE BIOGEL PI IND STRL 6.5 (GLOVE) ×1 IMPLANT
GLOVE BIOGEL PI IND STRL 7.0 (GLOVE) IMPLANT
GLOVE BIOGEL PI IND STRL 7.5 (GLOVE) ×1 IMPLANT
GLOVE BIOGEL PI INDICATOR 6.5 (GLOVE) ×1
GLOVE BIOGEL PI INDICATOR 7.0 (GLOVE)
GLOVE BIOGEL PI INDICATOR 7.5 (GLOVE) ×1
GLOVE ECLIPSE 6.5 STRL STRAW (GLOVE) IMPLANT
GLOVE ECLIPSE 7.0 STRL STRAW (GLOVE) IMPLANT
GLOVE ECLIPSE 7.5 STRL STRAW (GLOVE) IMPLANT
GLOVE EXAM NITRILE LRG STRL (GLOVE) IMPLANT
GLOVE EXAM NITRILE MD LF STRL (GLOVE) ×2 IMPLANT
GLOVE SKINSENSE NS SZ6.5 (GLOVE)
GLOVE SKINSENSE NS SZ7.0 (GLOVE)
GLOVE SKINSENSE STRL SZ6.5 (GLOVE) IMPLANT
GLOVE SKINSENSE STRL SZ7.0 (GLOVE) IMPLANT
KIT VITRECTOMY (OPHTHALMIC RELATED) IMPLANT
PAD ARMBOARD 7.5X6 YLW CONV (MISCELLANEOUS) ×2 IMPLANT
PROC W NO LENS (INTRAOCULAR LENS)
PROC W SPEC LENS (INTRAOCULAR LENS)
PROCESS W NO LENS (INTRAOCULAR LENS) IMPLANT
PROCESS W SPEC LENS (INTRAOCULAR LENS) IMPLANT
RING MALYGIN (MISCELLANEOUS) IMPLANT
SIGHTPATH CAT PROC W REG LENS (Ophthalmic Related) ×2 IMPLANT
SYR TB 1ML LL NO SAFETY (SYRINGE) ×2 IMPLANT
TAPE SURG TRANSPORE 1 IN (GAUZE/BANDAGES/DRESSINGS) ×1 IMPLANT
TAPE SURGICAL TRANSPORE 1 IN (GAUZE/BANDAGES/DRESSINGS) ×1
VISCOELASTIC ADDITIONAL (OPHTHALMIC RELATED) IMPLANT
WATER STERILE IRR 250ML POUR (IV SOLUTION) ×2 IMPLANT

## 2011-10-28 NOTE — H&P (Signed)
I have reviewed the H&P, the patient was re-examined, and I have identified no interval changes in medical condition and plan of care since the history and physical of record  

## 2011-10-28 NOTE — Anesthesia Postprocedure Evaluation (Signed)
  Anesthesia Post-op Note  Patient: Vickie Love  Procedure(s) Performed:  CATARACT EXTRACTION PHACO AND INTRAOCULAR LENS PLACEMENT (IOC) - CDE:13.84  Patient Location: PACU and Short Stay  Anesthesia Type: MAC  Level of Consciousness: awake, alert  and oriented  Airway and Oxygen Therapy: Patient Spontanous Breathing  Post-op Pain: none  Post-op Assessment: Post-op Vital signs reviewed, Patient's Cardiovascular Status Stable, Respiratory Function Stable and No signs of Nausea or vomiting  Post-op Vital Signs: Reviewed and stable  Complications: No apparent anesthesia complications

## 2011-10-28 NOTE — Anesthesia Preprocedure Evaluation (Signed)
Anesthesia Evaluation  Patient identified by MRN, date of birth, ID band Patient awake    Reviewed: Allergy & Precautions, H&P , NPO status , Patient's Chart, lab work & pertinent test results  History of Anesthesia Complications Negative for: history of anesthetic complications  Airway Mallampati: I      Dental  (+) Teeth Intact   Pulmonary neg pulmonary ROS,  clear to auscultation        Cardiovascular neg cardio ROS Regular Normal    Neuro/Psych    GI/Hepatic negative GI ROS,   Endo/Other    Renal/GU      Musculoskeletal  (+) Arthritis -, Osteoarthritis,    Abdominal   Peds  Hematology   Anesthesia Other Findings   Reproductive/Obstetrics                           Anesthesia Physical Anesthesia Plan  ASA: II  Anesthesia Plan: MAC   Post-op Pain Management:    Induction: Intravenous  Airway Management Planned: Nasal Cannula  Additional Equipment:   Intra-op Plan:   Post-operative Plan:   Informed Consent: I have reviewed the patients History and Physical, chart, labs and discussed the procedure including the risks, benefits and alternatives for the proposed anesthesia with the patient or authorized representative who has indicated his/her understanding and acceptance.     Plan Discussed with:   Anesthesia Plan Comments:         Anesthesia Quick Evaluation

## 2011-10-28 NOTE — Transfer of Care (Signed)
Immediate Anesthesia Transfer of Care Note  Patient: Vickie Love  Procedure(s) Performed:  CATARACT EXTRACTION PHACO AND INTRAOCULAR LENS PLACEMENT (IOC) - CDE:13.84  Patient Location: PACU and Short Stay  Anesthesia Type: MAC  Level of Consciousness: awake  Airway & Oxygen Therapy: Patient Spontanous Breathing  Post-op Assessment: Report given to PACU RN  Post vital signs: Reviewed and stable  Complications: No apparent anesthesia complications

## 2011-10-29 NOTE — Op Note (Signed)
Vickie Love, Vickie Love                  ACCOUNT NO.:  [id number]  MEDICAL RECORD NO.:  [id number]  LOCATION:  [facility]                          FACILITY:  [facility]  PHYSICIAN:  Susanne Greenhouse, MD       DATE OF BIRTH:  March 11, 1946  DATE OF PROCEDURE:  10/28/2011 DATE OF DISCHARGE:  10/28/2011                              OPERATIVE REPORT   PREOPERATIVE DIAGNOSIS:  Combined cataract, right eye, diagnosis code 366.19.  POSTOPERATIVE DIAGNOSIS:  Combined cataract, right eye, diagnosis code 366.19.  OPERATION PERFORMED:  Phacoemulsification with posterior chamber intraocular lens implantation, right eye.  SURGEON:  Bonne Dolores. Taelynn Mcelhannon, MD.  ANESTHESIA:  General endotracheal anesthesia.  OPERATIVE SUMMARY:  In the preoperative area, dilating drops were placed into the right eye.  The patient was then brought into the operating room where she was placed under general anesthesia.  The eye was then prepped and draped.  Beginning with a 75 blade, a paracentesis port was made at the surgeon's 2 o'clock position.  The anterior chamber was then filled with a 1% nonpreserved lidocaine solution with epinephrine.  This was followed by Viscoat to deepen the chamber.  A small fornix-based peritomy was performed superiorly.  Next, a single iris hook was placed through the limbus superiorly.  A 2.4-mm keratome blade was then used to make a clear corneal incision over the iris hook.  A bent cystotome needle and Utrata forceps were used to create a continuous tear capsulotomy.  Hydrodissection was performed using balanced salt solution on a fine cannula.  The lens nucleus was then removed using phacoemulsification in a quadrant cracking technique.  The cortical material was then removed with irrigation and aspiration.  The capsular bag and anterior chamber were refilled with Provisc.  The wound was widened to approximately 3 mm and a posterior chamber intraocular lens was placed into the capsular bag without difficulty  using an [employer] lens injecting system.  A single 10-0 nylon suture was then used to close the incision as well as stromal hydration.  The Provisc was removed from the anterior chamber and capsular bag with irrigation and aspiration.  At this point, the wounds were tested for leak, which were negative.  The anterior chamber remained deep and stable.  The patient tolerated the procedure well.  There were no operative complications, and she awoke from general anesthesia without problem.  No surgical specimens.  Prosthetic device used is a Cabin crew posterior chamber lens, model MX60, power of 25.0, serial number is [id number].          ______________________________ Susanne Greenhouse, MD     KEH/MEDQ  D:  10/28/2011  T:  10/29/2011  Job:  [id number]

## 2011-10-30 ENCOUNTER — Encounter: Payer: Self-pay | Admitting: Cardiovascular Disease

## 2011-11-04 ENCOUNTER — Encounter (HOSPITAL_COMMUNITY): Payer: Self-pay | Admitting: Ophthalmology

## 2013-11-19 ENCOUNTER — Emergency Department (HOSPITAL_COMMUNITY)
Admission: EM | Admit: 2013-11-19 | Discharge: 2013-11-19 | Disposition: A | Discharge status: Home / Self Care | Payer: Medicare HMO | Attending: Emergency Medicine | Admitting: Emergency Medicine

## 2013-11-19 ENCOUNTER — Encounter (HOSPITAL_COMMUNITY): Payer: Self-pay | Admitting: Emergency Medicine

## 2013-11-19 DIAGNOSIS — M129 Arthropathy, unspecified: Secondary | ICD-10-CM | POA: Insufficient documentation

## 2013-11-19 DIAGNOSIS — H60399 Other infective otitis externa, unspecified ear: Secondary | ICD-10-CM | POA: Insufficient documentation

## 2013-11-19 DIAGNOSIS — Z79899 Other long term (current) drug therapy: Secondary | ICD-10-CM | POA: Insufficient documentation

## 2013-11-19 DIAGNOSIS — R002 Palpitations: Secondary | ICD-10-CM | POA: Insufficient documentation

## 2013-11-19 DIAGNOSIS — Z91040 Latex allergy status: Secondary | ICD-10-CM | POA: Insufficient documentation

## 2013-11-19 DIAGNOSIS — H6091 Unspecified otitis externa, right ear: Secondary | ICD-10-CM

## 2013-11-19 MED ORDER — ONDANSETRON HCL 4 MG PO TABS
4.0000 mg | ORAL_TABLET | Freq: Once | ORAL | Status: AC
  Administered 2013-11-19: 4 mg via ORAL

## 2013-11-19 MED ORDER — ONDANSETRON HCL 4 MG PO TABS
4.0000 mg | ORAL_TABLET | Freq: Once | ORAL | Status: DC
  Filled 2013-11-19: qty 1

## 2013-11-19 MED ORDER — PREDNISONE 10 MG PO TABS: ORAL_TABLET | ORAL | Status: DC

## 2013-11-19 MED ORDER — AMOXICILLIN-POT CLAVULANATE 875-125 MG PO TABS: 1.0000 | ORAL_TABLET | Freq: Two times a day (BID) | ORAL | Status: DC

## 2013-11-19 MED ORDER — AMOXICILLIN-POT CLAVULANATE 875-125 MG PO TABS
1.0000 | ORAL_TABLET | Freq: Once | ORAL | Status: AC
  Administered 2013-11-19: 1 via ORAL

## 2013-11-19 MED ORDER — PREDNISONE 10 MG PO TABS
60.0000 mg | ORAL_TABLET | Freq: Once | ORAL | Status: DC
  Filled 2013-11-19 (×2): qty 1

## 2013-11-19 MED ORDER — AMOXICILLIN-POT CLAVULANATE 875-125 MG PO TABS
1.0000 | ORAL_TABLET | Freq: Once | ORAL | Status: DC
  Filled 2013-11-19: qty 1

## 2013-11-19 MED ORDER — PREDNISONE 10 MG PO TABS
60.0000 mg | ORAL_TABLET | Freq: Once | ORAL | Status: AC
  Administered 2013-11-19: 60 mg via ORAL

## 2013-11-19 NOTE — ED Provider Notes (Signed)
See prior note   Janice Norrie, MD 11/19/13 2131

## 2013-11-19 NOTE — ED Notes (Signed)
Swelling to rt ear, with redness and warmth.  Ext canal appears swollen also.

## 2013-11-19 NOTE — Discharge Instructions (Signed)
Please use Augmentin 2 times daily after eating. Please use prednisone taper as prescribed, please take with food. May use Tylenol or ibuprofen for soreness. Please return to the emergency department immediately if any high fever, or if any red streaking in the scalp or toward the face. Please see the ear nose and throat specialist listed above if not improving in the next 4 or 5 days.

## 2013-11-19 NOTE — ED Notes (Signed)
Pt reports woke up this morning with right ear swollen, warm to touch, and painful to touch. Denies any changes in hearing.

## 2013-11-19 NOTE — ED Provider Notes (Signed)
Pt wore earrings she has worn in the past yesterday. Started having some itching and swelling in her right ear lobe where the ear ring had been and during the day the swelling started to spread. Has mild pain. No fever. Has never had this before.  Pt has mild diffuse swelling and redness of her right helix with swelling of the opening of the ear canal, but the ear canal itself if normal, TM normal. No swelling of the face around the ear.    Medical screening examination/treatment/procedure(s) were conducted as a shared visit with non-physician practitioner(s) and myself.  I personally evaluated the patient during the encounter.  EKG Interpretation   None        Rolland Porter, MD, Abram Sander   Janice Norrie, MD 11/19/13 2129

## 2013-11-19 NOTE — ED Provider Notes (Signed)
CSN: [id number]     Arrival date & time 11/19/13  1905 History   First MD Initiated Contact with Patient 11/19/13 2037     Chief Complaint  Patient presents with  . Facial Swelling   (Consider location/radiation/quality/duration/timing/severity/associated sxs/prior Treatment) HPI Comments: Patient is a 68 year old female who presents to the emergency department with a complaint of" my ears swollen". The patient states that on last evening she noted that when she would lay on her right side that her ear and the side of her face was sore. This morning upon awaking she noted that her ear was swollen. She tried various conservative measures during the day, but there's been no improvement in the swelling of the ear. She's not had any drainage or discharge from the ear. She's not had any high fever. There's been no injury reported to the ear. It is of note that the patient uses Q-tips from time to time, the patient states she's been doing this for quite some time. The patient has not had any chemicals on the scalp or in the here recently. His been no drainage or discharge from the right ear. Patient has not had this problem in the past.  The history is provided by the patient.    Past Medical History  Diagnosis Date  . HOH (hard of hearing)   . Arthritis   . Palpitations   . Dizziness    Past Surgical History  Procedure Laterality Date  . Eye surgery  02/2009    left eye cataract removal   . Back surgery  14 yrs ago    [facility]  . Breast surgery      lumpectomies x4 at [facility]  . Knee arthroscopy      right  . Cataract extraction w/phaco  10/28/2011    Procedure: CATARACT EXTRACTION PHACO AND INTRAOCULAR LENS PLACEMENT (IOC);  Surgeon: Tonny Branch;  Location: [facility];  Service: Ophthalmology;  Laterality: Right;  CDE:13.84   Family History  Problem Relation Age of Onset  . Anesthesia problems Neg Hx   . Hypotension Neg Hx   . Malignant hyperthermia Neg Hx   . Pseudochol deficiency Neg Hx   .  Stroke Father    History  Substance Use Topics  . Smoking status: Never Smoker   . Smokeless tobacco: Not on file  . Alcohol Use: No   OB History   Grav Para Term Preterm Abortions TAB SAB Ect Mult Living                 Review of Systems  Constitutional: Negative for activity change.       All ROS Neg except as noted in HPI  HENT: Positive for ear pain. Negative for nosebleeds.   Eyes: Negative for photophobia and discharge.  Respiratory: Negative for cough, shortness of breath and wheezing.   Cardiovascular: Positive for palpitations. Negative for chest pain.  Gastrointestinal: Negative for abdominal pain and blood in stool.  Genitourinary: Negative for dysuria, frequency and hematuria.  Musculoskeletal: Positive for arthralgias. Negative for back pain and neck pain.  Skin: Negative.   Neurological: Negative for dizziness, seizures and speech difficulty.  Psychiatric/Behavioral: Negative for hallucinations and confusion.    Allergies  Codeine and Latex  Home Medications   Current Outpatient Rx  Name  Route  Sig  Dispense  Refill  . ARTIFICIAL TEAR OP   Ophthalmic   Apply 1 drop to eye daily as needed. Dry Eyes          .  Biotin 1000 MCG tablet   Oral   Take 1,000 mcg by mouth daily.           . Calcium Carbonate (CALCIUM 600 PO)   Oral   Take 1 tablet by mouth daily.           . Ergocalciferol 400 UNITS TABS   Oral   Take 2 tablets by mouth every morning.           . folic acid (FOLVITE) 160 MCG tablet   Oral   Take 800 mcg by mouth daily.          . Omega-3 Fatty Acids (FISH OIL) 1000 MG CAPS   Oral   Take 1 capsule by mouth daily.           . temazepam (RESTORIL) 30 MG capsule   Oral   Take 30 mg by mouth at bedtime as needed. Sleep aid, patient works 3rd shift           BP 134/76  Pulse 88  Temp(Src) 98.2 F (36.8 C) (Oral)  Resp 18  Ht 5\' 6"  (1.676 m)  Wt 135 lb (61.236 kg)  BMI 21.80 kg/m2  SpO2 97% Physical Exam  Nursing  note and vitals reviewed. Constitutional: She is oriented to person, place, and time. She appears well-developed and well-nourished.  Non-toxic appearance.  HENT:  Head: Normocephalic.  Right Ear: Tympanic membrane and external ear normal.  Left Ear: Tympanic membrane and external ear normal.  There is swelling of the right external ear from the pinna to the lobe of the ear. There is no foreign body noted in the external auditory canal. There are 2 areas of increased redness at the 8:00 and 4:00 positions of the right year. There's no drainage noted. There is no red streaking noted from the ear to the scalp or to the face. There is soreness in the anterior periventricular area, there is no soreness, redness, or swelling of the mastoid area. There no lesions noted of the scalp.  Eyes: EOM and lids are normal. Pupils are equal, round, and reactive to light.  Neck: Normal range of motion. Neck supple. Carotid bruit is not present.  Cardiovascular: Normal rate, regular rhythm, normal heart sounds, intact distal pulses and normal pulses.   Pulmonary/Chest: Breath sounds normal. No respiratory distress.  Few scattered rhonchi present.  Abdominal: Soft. Bowel sounds are normal. There is no tenderness. There is no guarding.  Musculoskeletal: Normal range of motion.  Lymphadenopathy:       Head (right side): No submandibular adenopathy present.       Head (left side): No submandibular adenopathy present.    She has no cervical adenopathy.  Neurological: She is alert and oriented to person, place, and time. She has normal strength. No cranial nerve deficit or sensory deficit.  Skin: Skin is warm and dry.  Psychiatric: She has a normal mood and affect. Her speech is normal.    ED Course  Procedures (including critical care time) Labs Review Labs Reviewed - No data to display Imaging Review No results found.  EKG Interpretation   None       MDM  No diagnosis found. *I have reviewed nursing  notes, vital signs, and all appropriate lab and imaging results for this patient.*  Para 2 areas of increased redness of the external auditory canal on the right. Suspect the patient has an external otitis with exacerbation. The plan at this time is for the patient to be placed  on prednisone taper, and Augmentin. The patient is to see the ear nose and throat specialist if not improving in the next 4-5 days. Patient is to return to the emergency apartment sooner if any high fever, or changes in condition. Patient seen with me by Dr. Tomi Bamberger.  Lenox Ahr, PA-C 11/19/13 2126

## 2016-06-20 ENCOUNTER — Telehealth: Payer: Self-pay | Admitting: Internal Medicine

## 2016-06-20 NOTE — Telephone Encounter (Signed)
PT is on recall for Dec 2017.

## 2016-06-20 NOTE — Telephone Encounter (Signed)
Pt called to see if it was too soon to schedule her 10 year colonoscopy. She is due in December. Please call [phone]

## 2016-06-24 NOTE — Telephone Encounter (Signed)
Called. Many rings and no answer. Mailing a letter for her to call and discuss if any issues no, she is on our recall for 10/2016.

## 2016-07-03 ENCOUNTER — Other Ambulatory Visit: Payer: Self-pay | Admitting: Orthopedic Surgery

## 2016-08-06 ENCOUNTER — Encounter (HOSPITAL_COMMUNITY): Payer: Self-pay

## 2016-08-06 ENCOUNTER — Encounter (HOSPITAL_COMMUNITY)
Admission: RE | Admit: 2016-08-06 | Discharge: 2016-08-06 | Disposition: A | Discharge status: Home / Self Care | Payer: Commercial Managed Care - HMO | Source: Ambulatory Visit | Attending: Orthopedic Surgery | Admitting: Orthopedic Surgery

## 2016-08-06 DIAGNOSIS — M1712 Unilateral primary osteoarthritis, left knee: Secondary | ICD-10-CM | POA: Insufficient documentation

## 2016-08-06 DIAGNOSIS — Z01818 Encounter for other preprocedural examination: Secondary | ICD-10-CM | POA: Diagnosis present

## 2016-08-06 LAB — COMPREHENSIVE METABOLIC PANEL
ALBUMIN: 3.7 g/dL (ref 3.5–5.0)
ALK PHOS: 44 U/L (ref 38–126)
ALT: 14 U/L (ref 14–54)
ANION GAP: 8 (ref 5–15)
AST: 24 U/L (ref 15–41)
BUN: 11 mg/dL (ref 6–20)
CALCIUM: 9.4 mg/dL (ref 8.9–10.3)
CHLORIDE: 103 mmol/L (ref 101–111)
CO2: 30 mmol/L (ref 22–32)
Creatinine, Ser: 0.93 mg/dL (ref 0.44–1.00)
GFR calc non Af Amer: 60 mL/min — ABNORMAL LOW (ref >60)
GLUCOSE: 105 mg/dL — AB (ref 65–99)
Potassium: 4 mmol/L (ref 3.5–5.1)
SODIUM: 141 mmol/L (ref 135–145)
Total Bilirubin: 0.6 mg/dL (ref 0.3–1.2)
Total Protein: 6.4 g/dL — ABNORMAL LOW (ref 6.5–8.1)

## 2016-08-06 LAB — CBC WITH DIFFERENTIAL/PLATELET
BASOS PCT: 1 %
Basophils Absolute: 0 10*3/uL (ref 0.0–0.1)
EOS ABS: 0.1 10*3/uL (ref 0.0–0.7)
EOS PCT: 2 %
HCT: 39.9 % (ref 36.0–46.0)
HEMOGLOBIN: 13.1 g/dL (ref 12.0–15.0)
LYMPHS ABS: 1.4 10*3/uL (ref 0.7–4.0)
Lymphocytes Relative: 33 %
MCH: 30.5 pg (ref 26.0–34.0)
MCHC: 32.8 g/dL (ref 30.0–36.0)
MCV: 92.8 fL (ref 78.0–100.0)
MONOS PCT: 12 %
Monocytes Absolute: 0.5 10*3/uL (ref 0.1–1.0)
NEUTROS PCT: 52 %
Neutro Abs: 2.2 10*3/uL (ref 1.7–7.7)
PLATELETS: 150 10*3/uL (ref 150–400)
RBC: 4.3 MIL/uL (ref 3.87–5.11)
RDW: 12.8 % (ref 11.5–15.5)
WBC: 4.1 10*3/uL (ref 4.0–10.5)

## 2016-08-06 LAB — SURGICAL PCR SCREEN
MRSA, PCR: NEGATIVE
STAPHYLOCOCCUS AUREUS: NEGATIVE

## 2016-08-06 LAB — PROTIME-INR
INR: 1.06
Prothrombin Time: 13.8 seconds (ref 11.4–15.2)

## 2016-08-06 NOTE — Pre-Procedure Instructions (Signed)
Vickie Love  08/06/2016      [facility] [place] [place] [id number] [place] [id number] Phone: [phone] Fax: [phone]    Your procedure is scheduled on  Monday 08/19/16  Report to [facility] Admitting at 530 A.M.  Call this number if you have problems the morning of surgery:  [phone]   Remember:  Do not eat food or drink liquids after midnight.  Take these medicines the morning of surgery with A SIP OF WATER   NONE       (STOP 7 DAYS PRIOR TO SURGERY- ASPIRIN OR ASPIRIN PRODUCTS, IBUPROFEN/ ADVIL/ MOTRIN/ ALEVE, GOODY POWDERS/ BC'S, HERBAL MEDICINES, VITAMINS)   Do not wear jewelry, make-up or nail polish.  Do not wear lotions, powders, or perfumes, or deoderant.  Do not shave 48 hours prior to surgery.  Men may shave face and neck.  Do not bring valuables to the hospital.  [facility] is not responsible for any belongings or valuables.  Contacts, dentures or bridgework may not be worn into surgery.  Leave your suitcase in the car.  After surgery it may be brought to your room.  For patients admitted to the hospital, discharge time will be determined by your treatment team.  Patients discharged the day of surgery will not be allowed to drive home.   Name and phone number of your driver:    Special instructions:  [facility] - Preparing for Surgery  Before surgery, you can play an important role.  Because skin is not sterile, your skin needs to be as free of germs as possible.  You can reduce the number of germs on you skin by washing with CHG (chlorahexidine gluconate) soap before surgery.  CHG is an antiseptic cleaner which kills germs and bonds with the skin to continue killing germs even after washing.  Please DO NOT use if you have an allergy to CHG or antibacterial soaps.  If your skin becomes reddened/irritated stop using the CHG and inform your nurse when you arrive at Short Stay.  Do not  shave (including legs and underarms) for at least 48 hours prior to the first CHG shower.  You may shave your face.  Please follow these instructions carefully:   1.  Shower with CHG Soap the night before surgery and the                                morning of Surgery.  2.  If you choose to wash your hair, wash your hair first as usual with your       normal shampoo.  3.  After you shampoo, rinse your hair and body thoroughly to remove the                      Shampoo.  4.  Use CHG as you would any other liquid soap.  You can apply chg directly       to the skin and wash gently with scrungie or a clean washcloth.  5.  Apply the CHG Soap to your body ONLY FROM THE NECK DOWN.        Do not use on open wounds or open sores.  Avoid contact with your eyes,       ears, mouth and genitals (private parts).  Wash genitals (private parts)       with  your normal soap.  6.  Wash thoroughly, paying special attention to the area where your surgery        will be performed.  7.  Thoroughly rinse your body with warm water from the neck down.  8.  DO NOT shower/wash with your normal soap after using and rinsing off       the CHG Soap.  9.  Pat yourself dry with a clean towel.            10.  Wear clean pajamas.            11.  Place clean sheets on your bed the night of your first shower and do not        sleep with pets.  Day of Surgery  Do not apply any lotions/deoderants the morning of surgery.  Please wear clean clothes to the hospital/surgery center.    Please read over the following fact sheets that you were given. Total Joint Packet, MRSA Information and Surgical Site Infection Prevention

## 2016-08-16 MED ORDER — TRANEXAMIC ACID 1000 MG/10ML IV SOLN
1000.0000 mg | INTRAVENOUS | Status: AC
  Administered 2016-08-19: 1000 mg via INTRAVENOUS
  Filled 2016-08-16: qty 10

## 2016-08-18 MED ORDER — CEFAZOLIN SODIUM-DEXTROSE 2-4 GM/100ML-% IV SOLN
2.0000 g | INTRAVENOUS | Status: AC
  Administered 2016-08-19: 2 g via INTRAVENOUS
  Filled 2016-08-18: qty 100

## 2016-08-18 NOTE — Anesthesia Preprocedure Evaluation (Addendum)
Anesthesia Evaluation  Patient identified by MRN, date of birth, ID band Patient awake    Reviewed: Allergy & Precautions, NPO status , Patient's Chart, lab work & pertinent test results  History of Anesthesia Complications Negative for: history of anesthetic complications  Airway Mallampati: I  TM Distance: >3 FB Neck ROM: Full    Dental  (+) Caps, Dental Advisory Given   Pulmonary neg pulmonary ROS,    breath sounds clear to auscultation       Cardiovascular (-) anginanegative cardio ROS   Rhythm:Regular Rate:Normal  '11 stress ECHO: EF 60-70%, normal perfusion and valves   Neuro/Psych negative neurological ROS     GI/Hepatic negative GI ROS, Neg liver ROS,   Endo/Other  negative endocrine ROS  Renal/GU negative Renal ROS     Musculoskeletal  (+) Arthritis , Osteoarthritis,    Abdominal   Peds  Hematology negative hematology ROS (+)   Anesthesia Other Findings   Reproductive/Obstetrics                            Anesthesia Physical Anesthesia Plan  ASA: II  Anesthesia Plan: Spinal   Post-op Pain Management:  Regional for Post-op pain   Induction:   Airway Management Planned: Natural Airway and Simple Face Mask  Additional Equipment:   Intra-op Plan:   Post-operative Plan:   Informed Consent: I have reviewed the patients History and Physical, chart, labs and discussed the procedure including the risks, benefits and alternatives for the proposed anesthesia with the patient or authorized representative who has indicated his/her understanding and acceptance.   Dental advisory given  Plan Discussed with: CRNA and Surgeon  Anesthesia Plan Comments: (Plan routine monitors, SAB with adductor canal block for post op analgesia)        Anesthesia Quick Evaluation

## 2016-08-19 ENCOUNTER — Inpatient Hospital Stay (HOSPITAL_COMMUNITY)
Admission: RE | Admit: 2016-08-19 | Discharge: 2016-08-20 | DRG: 470 | Disposition: A | Discharge status: Home Health | Payer: Commercial Managed Care - HMO | Source: Ambulatory Visit | Attending: Orthopedic Surgery | Admitting: Orthopedic Surgery

## 2016-08-19 ENCOUNTER — Encounter (HOSPITAL_COMMUNITY): Payer: Self-pay | Admitting: *Deleted

## 2016-08-19 ENCOUNTER — Encounter (HOSPITAL_COMMUNITY)
Admission: RE | Disposition: A | Discharge status: Home Health | Payer: Self-pay | Source: Ambulatory Visit | Attending: Orthopedic Surgery

## 2016-08-19 ENCOUNTER — Inpatient Hospital Stay (HOSPITAL_COMMUNITY): Payer: Commercial Managed Care - HMO | Admitting: Anesthesiology

## 2016-08-19 DIAGNOSIS — H9190 Unspecified hearing loss, unspecified ear: Secondary | ICD-10-CM | POA: Diagnosis present

## 2016-08-19 DIAGNOSIS — M1712 Unilateral primary osteoarthritis, left knee: Secondary | ICD-10-CM | POA: Diagnosis present

## 2016-08-19 DIAGNOSIS — Z9842 Cataract extraction status, left eye: Secondary | ICD-10-CM | POA: Diagnosis not present

## 2016-08-19 DIAGNOSIS — Z7983 Long term (current) use of bisphosphonates: Secondary | ICD-10-CM

## 2016-08-19 DIAGNOSIS — Z9841 Cataract extraction status, right eye: Secondary | ICD-10-CM | POA: Diagnosis not present

## 2016-08-19 DIAGNOSIS — Z961 Presence of intraocular lens: Secondary | ICD-10-CM | POA: Diagnosis present

## 2016-08-19 DIAGNOSIS — M25562 Pain in left knee: Secondary | ICD-10-CM | POA: Diagnosis present

## 2016-08-19 DIAGNOSIS — Z96659 Presence of unspecified artificial knee joint: Secondary | ICD-10-CM

## 2016-08-19 HISTORY — PX: TOTAL KNEE ARTHROPLASTY: SHX125

## 2016-08-19 SURGERY — ARTHROPLASTY, KNEE, TOTAL
Anesthesia: Spinal | Site: Knee | Laterality: Left

## 2016-08-19 MED ORDER — SODIUM CHLORIDE 0.9 % IJ SOLN
INTRAMUSCULAR | Status: DC | PRN
  Administered 2016-08-19: 20 mL

## 2016-08-19 MED ORDER — MIDAZOLAM HCL 2 MG/2ML IJ SOLN
INTRAMUSCULAR | Status: AC
  Filled 2016-08-19: qty 2

## 2016-08-19 MED ORDER — SODIUM CHLORIDE 0.9 % IV SOLN
INTRAVENOUS | Status: DC
Start: 2016-08-19 — End: 2016-08-20
  Administered 2016-08-19 – 2016-08-20 (×2): via INTRAVENOUS

## 2016-08-19 MED ORDER — PROPOFOL 10 MG/ML IV BOLUS
INTRAVENOUS | Status: AC
  Filled 2016-08-19: qty 20

## 2016-08-19 MED ORDER — EPHEDRINE SULFATE 50 MG/ML IJ SOLN
INTRAMUSCULAR | Status: DC | PRN
  Administered 2016-08-19: 5 mg via INTRAVENOUS
  Administered 2016-08-19: 10 mg via INTRAVENOUS

## 2016-08-19 MED ORDER — BUPIVACAINE LIPOSOME 1.3 % IJ SUSP
20.0000 mL | INTRAMUSCULAR | Status: DC
  Filled 2016-08-19: qty 20

## 2016-08-19 MED ORDER — ACETAMINOPHEN 325 MG PO TABS
650.0000 mg | ORAL_TABLET | Freq: Four times a day (QID) | ORAL | Status: DC | PRN
  Administered 2016-08-19 – 2016-08-20 (×3): 650 mg via ORAL
  Filled 2016-08-19 (×3): qty 2

## 2016-08-19 MED ORDER — PROMETHAZINE HCL 25 MG/ML IJ SOLN: 6.2500 mg | INTRAMUSCULAR | Status: DC | PRN

## 2016-08-19 MED ORDER — BUPIVACAINE-EPINEPHRINE (PF) 0.25% -1:200000 IJ SOLN
INTRAMUSCULAR | Status: AC
  Filled 2016-08-19: qty 30

## 2016-08-19 MED ORDER — METOCLOPRAMIDE HCL 5 MG/ML IJ SOLN: 5.0000 mg | Freq: Three times a day (TID) | INTRAMUSCULAR | Status: DC | PRN

## 2016-08-19 MED ORDER — LACTATED RINGERS IV SOLN
INTRAVENOUS | Status: DC | PRN
Start: 2016-08-19 — End: 2016-08-19
  Administered 2016-08-19 (×2): via INTRAVENOUS

## 2016-08-19 MED ORDER — CHLORHEXIDINE GLUCONATE 4 % EX LIQD: 60.0000 mL | Freq: Once | CUTANEOUS | Status: DC

## 2016-08-19 MED ORDER — BISACODYL 5 MG PO TBEC: 5.0000 mg | DELAYED_RELEASE_TABLET | Freq: Every day | ORAL | Status: DC | PRN

## 2016-08-19 MED ORDER — MIDAZOLAM HCL 5 MG/5ML IJ SOLN
INTRAMUSCULAR | Status: DC | PRN
  Administered 2016-08-19: 2 mg via INTRAVENOUS

## 2016-08-19 MED ORDER — MEPERIDINE HCL 25 MG/ML IJ SOLN: 6.2500 mg | INTRAMUSCULAR | Status: DC | PRN

## 2016-08-19 MED ORDER — ALPRAZOLAM 0.5 MG PO TABS
1.0000 mg | ORAL_TABLET | Freq: Every evening | ORAL | Status: DC | PRN
Start: 2016-08-19 — End: 2016-08-20
  Administered 2016-08-19: 1 mg via ORAL
  Filled 2016-08-19: qty 2

## 2016-08-19 MED ORDER — MENTHOL 3 MG MT LOZG: 1.0000 | LOZENGE | OROMUCOSAL | Status: DC | PRN

## 2016-08-19 MED ORDER — BUPIVACAINE IN DEXTROSE 0.75-8.25 % IT SOLN
INTRATHECAL | Status: DC | PRN
  Administered 2016-08-19: 12 mg via INTRATHECAL

## 2016-08-19 MED ORDER — ACETAMINOPHEN 650 MG RE SUPP
650.0000 mg | Freq: Four times a day (QID) | RECTAL | Status: DC | PRN
Start: 2016-08-19 — End: 2016-08-20

## 2016-08-19 MED ORDER — HYDROMORPHONE HCL 1 MG/ML IJ SOLN
1.0000 mg | INTRAMUSCULAR | Status: DC | PRN
  Filled 2016-08-19: qty 1

## 2016-08-19 MED ORDER — DIPHENHYDRAMINE HCL 12.5 MG/5ML PO ELIX: 12.5000 mg | ORAL_SOLUTION | ORAL | Status: DC | PRN

## 2016-08-19 MED ORDER — PHENOL 1.4 % MT LIQD: 1.0000 | OROMUCOSAL | Status: DC | PRN

## 2016-08-19 MED ORDER — BUPIVACAINE LIPOSOME 1.3 % IJ SUSP
INTRAMUSCULAR | Status: DC | PRN
  Administered 2016-08-19: 20 mL

## 2016-08-19 MED ORDER — HYDROMORPHONE HCL 1 MG/ML IJ SOLN: 0.2500 mg | INTRAMUSCULAR | Status: DC | PRN

## 2016-08-19 MED ORDER — PHENYLEPHRINE HCL 10 MG/ML IJ SOLN
INTRAMUSCULAR | Status: DC | PRN
  Administered 2016-08-19 (×2): 60 ug via INTRAVENOUS

## 2016-08-19 MED ORDER — METOCLOPRAMIDE HCL 5 MG PO TABS: 5.0000 mg | ORAL_TABLET | Freq: Three times a day (TID) | ORAL | Status: DC | PRN

## 2016-08-19 MED ORDER — CEFAZOLIN IN D5W 1 GM/50ML IV SOLN
1.0000 g | Freq: Four times a day (QID) | INTRAVENOUS | Status: AC
  Administered 2016-08-19 (×2): 1 g via INTRAVENOUS
  Filled 2016-08-19 (×3): qty 50

## 2016-08-19 MED ORDER — FENTANYL CITRATE (PF) 100 MCG/2ML IJ SOLN
INTRAMUSCULAR | Status: AC
  Filled 2016-08-19: qty 2

## 2016-08-19 MED ORDER — MIDAZOLAM HCL 2 MG/2ML IJ SOLN: 0.5000 mg | Freq: Once | INTRAMUSCULAR | Status: DC | PRN

## 2016-08-19 MED ORDER — FLEET ENEMA 7-19 GM/118ML RE ENEM: 1.0000 | ENEMA | Freq: Once | RECTAL | Status: DC | PRN

## 2016-08-19 MED ORDER — 0.9 % SODIUM CHLORIDE (POUR BTL) OPTIME
TOPICAL | Status: DC | PRN
Start: 2016-08-19 — End: 2016-08-19
  Administered 2016-08-19: 1000 mL

## 2016-08-19 MED ORDER — TEMAZEPAM 15 MG PO CAPS: 30.0000 mg | ORAL_CAPSULE | Freq: Every evening | ORAL | Status: DC | PRN

## 2016-08-19 MED ORDER — METHOCARBAMOL 500 MG PO TABS: 500.0000 mg | ORAL_TABLET | Freq: Four times a day (QID) | ORAL | Status: DC | PRN

## 2016-08-19 MED ORDER — DEXAMETHASONE SODIUM PHOSPHATE 10 MG/ML IJ SOLN
8.0000 mg | Freq: Once | INTRAMUSCULAR | Status: AC
  Administered 2016-08-19: 8 mg via INTRAVENOUS
  Filled 2016-08-19: qty 1

## 2016-08-19 MED ORDER — SODIUM CHLORIDE 0.9 % IR SOLN
Status: DC | PRN
  Administered 2016-08-19: 3000 mL

## 2016-08-19 MED ORDER — ASPIRIN EC 325 MG PO TBEC
325.0000 mg | DELAYED_RELEASE_TABLET | Freq: Two times a day (BID) | ORAL | Status: DC
  Administered 2016-08-19 – 2016-08-20 (×3): 325 mg via ORAL
  Filled 2016-08-19 (×4): qty 1

## 2016-08-19 MED ORDER — SODIUM CHLORIDE 0.9 % IV SOLN: INTRAVENOUS | Status: DC

## 2016-08-19 MED ORDER — ZOLPIDEM TARTRATE 5 MG PO TABS: 5.0000 mg | ORAL_TABLET | Freq: Every evening | ORAL | Status: DC | PRN

## 2016-08-19 MED ORDER — DOCUSATE SODIUM 100 MG PO CAPS
100.0000 mg | ORAL_CAPSULE | Freq: Two times a day (BID) | ORAL | Status: DC
  Administered 2016-08-19 – 2016-08-20 (×3): 100 mg via ORAL
  Filled 2016-08-19 (×3): qty 1

## 2016-08-19 MED ORDER — DIPHENHYDRAMINE HCL 50 MG/ML IJ SOLN
INTRAMUSCULAR | Status: DC | PRN
  Administered 2016-08-19: 25 mg via INTRAVENOUS

## 2016-08-19 MED ORDER — BUPIVACAINE-EPINEPHRINE (PF) 0.25% -1:200000 IJ SOLN
INTRAMUSCULAR | Status: DC | PRN
  Administered 2016-08-19: 30 mL

## 2016-08-19 MED ORDER — DEXAMETHASONE SODIUM PHOSPHATE 10 MG/ML IJ SOLN
10.0000 mg | Freq: Once | INTRAMUSCULAR | Status: AC
  Administered 2016-08-20: 10 mg via INTRAVENOUS
  Filled 2016-08-19: qty 1

## 2016-08-19 MED ORDER — PROPOFOL 500 MG/50ML IV EMUL
INTRAVENOUS | Status: DC | PRN
  Administered 2016-08-19: 20 ug/kg/min via INTRAVENOUS

## 2016-08-19 MED ORDER — ONDANSETRON HCL 4 MG PO TABS: 4.0000 mg | ORAL_TABLET | Freq: Four times a day (QID) | ORAL | Status: DC | PRN

## 2016-08-19 MED ORDER — OXYCODONE HCL 5 MG PO TABS
5.0000 mg | ORAL_TABLET | ORAL | Status: DC | PRN
  Administered 2016-08-19 – 2016-08-20 (×4): 10 mg via ORAL
  Filled 2016-08-19 (×4): qty 2

## 2016-08-19 MED ORDER — OXYCODONE HCL ER 10 MG PO T12A
10.0000 mg | EXTENDED_RELEASE_TABLET | Freq: Two times a day (BID) | ORAL | Status: DC
  Administered 2016-08-19 – 2016-08-20 (×3): 10 mg via ORAL
  Filled 2016-08-19 (×3): qty 1

## 2016-08-19 MED ORDER — ACETAMINOPHEN 500 MG PO TABS
1000.0000 mg | ORAL_TABLET | Freq: Once | ORAL | Status: AC
  Administered 2016-08-19: 1000 mg via ORAL
  Filled 2016-08-19: qty 2

## 2016-08-19 MED ORDER — ALUM & MAG HYDROXIDE-SIMETH 200-200-20 MG/5ML PO SUSP: 30.0000 mL | ORAL | Status: DC | PRN

## 2016-08-19 MED ORDER — ROPIVACAINE HCL 7.5 MG/ML IJ SOLN
INTRAMUSCULAR | Status: DC | PRN
  Administered 2016-08-19: 20 mL via PERINEURAL

## 2016-08-19 MED ORDER — ONDANSETRON HCL 4 MG/2ML IJ SOLN: 4.0000 mg | Freq: Four times a day (QID) | INTRAMUSCULAR | Status: DC | PRN

## 2016-08-19 MED ORDER — GABAPENTIN 300 MG PO CAPS
300.0000 mg | ORAL_CAPSULE | Freq: Three times a day (TID) | ORAL | Status: DC
Start: 2016-08-19 — End: 2016-08-20
  Administered 2016-08-19 – 2016-08-20 (×4): 300 mg via ORAL
  Filled 2016-08-19 (×4): qty 1

## 2016-08-19 MED ORDER — CELECOXIB 200 MG PO CAPS
200.0000 mg | ORAL_CAPSULE | Freq: Two times a day (BID) | ORAL | Status: DC
  Administered 2016-08-19 – 2016-08-20 (×3): 200 mg via ORAL
  Filled 2016-08-19 (×3): qty 1

## 2016-08-19 MED ORDER — METHOCARBAMOL 1000 MG/10ML IJ SOLN
500.0000 mg | Freq: Four times a day (QID) | INTRAVENOUS | Status: DC | PRN
  Filled 2016-08-19: qty 5

## 2016-08-19 MED ORDER — LIDOCAINE HCL (CARDIAC) 20 MG/ML IV SOLN
INTRAVENOUS | Status: DC | PRN
  Administered 2016-08-19: 20 mg via INTRATRACHEAL

## 2016-08-19 MED ORDER — TRANEXAMIC ACID 1000 MG/10ML IV SOLN
1000.0000 mg | Freq: Once | INTRAVENOUS | Status: AC
  Administered 2016-08-19: 1000 mg via INTRAVENOUS
  Filled 2016-08-19 (×3): qty 10

## 2016-08-19 MED ORDER — SENNOSIDES-DOCUSATE SODIUM 8.6-50 MG PO TABS: 1.0000 | ORAL_TABLET | Freq: Every evening | ORAL | Status: DC | PRN

## 2016-08-19 MED ORDER — FENTANYL CITRATE (PF) 100 MCG/2ML IJ SOLN
INTRAMUSCULAR | Status: DC | PRN
  Administered 2016-08-19 (×2): 50 ug via INTRAVENOUS

## 2016-08-19 SURGICAL SUPPLY — 55 items
ARTISURF 10M PLY L 6-9CD KNEE (Knees) ×3 IMPLANT
BANDAGE ELASTIC 6 VELCRO ST LF (GAUZE/BANDAGES/DRESSINGS) ×3 IMPLANT
BANDAGE ESMARK 6X9 LF (GAUZE/BANDAGES/DRESSINGS) ×1 IMPLANT
BLADE SAGITTAL 13X1.27X60 (BLADE) ×2 IMPLANT
BLADE SAGITTAL 13X1.27X60MM (BLADE) ×1
BLADE SAW SGTL 83.5X18.5 (BLADE) ×3 IMPLANT
BNDG CMPR 9X6 STRL LF SNTH (GAUZE/BANDAGES/DRESSINGS) ×1
BNDG ESMARK 6X9 LF (GAUZE/BANDAGES/DRESSINGS) ×3
BOWL SMART MIX CTS (DISPOSABLE) ×3 IMPLANT
BSPLAT TIB 5D C CMNT STM LT (Knees) ×1 IMPLANT
CEMENT BONE SIMPLEX SPEEDSET (Cement) ×6 IMPLANT
CLOSURE WOUND 1/2 X4 (GAUZE/BANDAGES/DRESSINGS) ×1
COVER SURGICAL LIGHT HANDLE (MISCELLANEOUS) ×3 IMPLANT
CUFF TOURNIQUET SINGLE 24IN (TOURNIQUET CUFF) ×3 IMPLANT
DRAPE EXTREMITY T 121X128X90 (DRAPE) ×3 IMPLANT
DRAPE INCISE IOBAN 66X45 STRL (DRAPES) ×6 IMPLANT
DRAPE U-SHAPE 47X51 STRL (DRAPES) ×3 IMPLANT
DRSG AQUACEL AG ADV 3.5X10 (GAUZE/BANDAGES/DRESSINGS) ×3 IMPLANT
DURAPREP 26ML APPLICATOR (WOUND CARE) ×6 IMPLANT
ELECT REM PT RETURN 9FT ADLT (ELECTROSURGICAL) ×3
ELECTRODE REM PT RTRN 9FT ADLT (ELECTROSURGICAL) ×1 IMPLANT
FEMUR  CMT CCR STD SZ7 L KNEE (Knees) ×2 IMPLANT
FEMUR CMT CCR STD SZ7 L KNEE (Knees) ×1 IMPLANT
FEMUR CMTD CCR STD SZ7 L KNEE (Knees) ×1 IMPLANT
GOWN STRL REUS W/ TWL LRG LVL3 (GOWN DISPOSABLE) ×1 IMPLANT
GOWN STRL REUS W/ TWL XL LVL3 (GOWN DISPOSABLE) ×2 IMPLANT
GOWN STRL REUS W/TWL 2XL LVL3 (GOWN DISPOSABLE) ×3 IMPLANT
GOWN STRL REUS W/TWL LRG LVL3 (GOWN DISPOSABLE) ×2
GOWN STRL REUS W/TWL XL LVL3 (GOWN DISPOSABLE) ×4
HANDPIECE INTERPULSE COAX TIP (DISPOSABLE) ×3
HOOD PEEL AWAY FACE SHEILD DIS (HOOD) ×9 IMPLANT
KIT BASIN OR (CUSTOM PROCEDURE TRAY) ×3 IMPLANT
KIT ROOM TURNOVER OR (KITS) ×3 IMPLANT
MANIFOLD NEPTUNE II (INSTRUMENTS) ×3 IMPLANT
NEEDLE 22X1 1/2 (OR ONLY) (NEEDLE) ×6 IMPLANT
NS IRRIG 1000ML POUR BTL (IV SOLUTION) ×3 IMPLANT
PACK TOTAL JOINT (CUSTOM PROCEDURE TRAY) ×3 IMPLANT
PACK UNIVERSAL I (CUSTOM PROCEDURE TRAY) ×3 IMPLANT
PAD ARMBOARD 7.5X6 YLW CONV (MISCELLANEOUS) ×6 IMPLANT
SET HNDPC FAN SPRY TIP SCT (DISPOSABLE) ×1 IMPLANT
STEM POLY PAT PLY 32M KNEE (Knees) ×3 IMPLANT
STEM TIBIA 5 DEG SZ C L KNEE (Knees) ×1 IMPLANT
STRIP CLOSURE SKIN 1/2X4 (GAUZE/BANDAGES/DRESSINGS) ×2 IMPLANT
SUT BONE WAX W31G (SUTURE) ×3 IMPLANT
SUT VIC AB 0 CTB1 27 (SUTURE) ×6 IMPLANT
SUT VIC AB 1 CT1 27 (SUTURE) ×6
SUT VIC AB 1 CT1 27XBRD ANBCTR (SUTURE) ×2 IMPLANT
SUT VIC AB 2-0 CT1 27 (SUTURE) ×6
SUT VIC AB 2-0 CT1 TAPERPNT 27 (SUTURE) ×2 IMPLANT
SYR 20CC LL (SYRINGE) ×6 IMPLANT
TIBIA STEM 5 DEG SZ C L KNEE (Knees) ×3 IMPLANT
TOWEL OR 17X24 6PK STRL BLUE (TOWEL DISPOSABLE) ×3 IMPLANT
TOWEL OR 17X26 10 PK STRL BLUE (TOWEL DISPOSABLE) ×3 IMPLANT
TRAY CATH 16FR W/PLASTIC CATH (SET/KITS/TRAYS/PACK) ×3 IMPLANT
WATER STERILE IRR 1000ML POUR (IV SOLUTION) ×6 IMPLANT

## 2016-08-19 NOTE — Anesthesia Procedure Notes (Signed)
Procedure Name: MAC Date/Time: 08/19/2016 7:30 AM Performed by: Mariea Clonts Pre-anesthesia Checklist: Patient identified, Emergency Drugs available, Suction available, Patient being monitored and Timeout performed Patient Re-evaluated:Patient Re-evaluated prior to inductionOxygen Delivery Method: Nasal cannula

## 2016-08-19 NOTE — Progress Notes (Signed)
Orthopedic Tech Progress Note Patient Details:  Vickie Love 1946/06/20 [id number]  CPM Left Knee CPM Left Knee: On Left Knee Flexion (Degrees): 90 Left Knee Extension (Degrees): 0 Additional Comments: Trapeze bar and foot roll   [place] Pink 08/19/2016, 10:36 AM

## 2016-08-19 NOTE — Evaluation (Signed)
Physical Therapy Evaluation Patient Details Name: Vickie Love MRN: [id number] DOB: 11-12-1946 Today's Date: 08/19/2016   History of Present Illness  Pt is a 71 y.o. female now s/p Lt TKA. PMH: dizziness, back surgery.    Clinical Impression  Pt is s/p TKA resulting in the deficits listed below (see PT Problem List). Pt making excellent progress, ambulating 150 ft with rw during initial session. Pt will benefit from skilled PT to increase their independence and safety with mobility to allow discharge to home with family assistance.      Follow Up Recommendations Home health PT;Supervision for mobility/OOB    Equipment Recommendations  None recommended by PT    Recommendations for Other Services       Precautions / Restrictions Precautions Precautions: Knee;Fall Precaution Booklet Issued: Yes (comment) Precaution Comments: HEP provided, reviewed knee extension precautions Restrictions Weight Bearing Restrictions: Yes LLE Weight Bearing: Weight bearing as tolerated      Mobility  Bed Mobility Overal bed mobility: Needs Assistance Bed Mobility: Supine to Sit     Supine to sit: Supervision        Transfers Overall transfer level: Needs assistance Equipment used: Rolling walker (2 wheeled) Transfers: Sit to/from Stand Sit to Stand: Min guard         General transfer comment: cues for hand placement with transfers  Ambulation/Gait Ambulation/Gait assistance: Min guard Ambulation Distance (Feet): 150 Feet Assistive device: Rolling walker (2 wheeled) Gait Pattern/deviations: Step-through pattern;Decreased weight shift to left Gait velocity: decreased   General Gait Details: Encouraging knee flexion with swing phase  Stairs            Wheelchair Mobility    Modified Rankin (Stroke Patients Only)       Balance Overall balance assessment: Needs assistance Sitting-balance support: No upper extremity supported Sitting balance-Leahy Scale: Good      Standing balance support: During functional activity Standing balance-Leahy Scale: Fair Standing balance comment: using rw during ambulation                              Pertinent Vitals/Pain Pain Assessment: Faces Faces Pain Scale: Hurts a little bit (reports no pain at rest) Pain Location: Lt knee Pain Descriptors / Indicators: Aching Pain Intervention(s): Limited activity within patient's tolerance;Monitored during session;Ice applied    Home Living Family/patient expects to be discharged to:: Private residence Living Arrangements: Spouse/significant other Available Help at Discharge: Family;Available 24 hours/day Type of Home: House Home Access: Stairs to enter Entrance Stairs-Rails: Right;Left;Can reach both Entrance Stairs-Number of Steps: 5 Home Layout: One level Home Equipment: Walker - 2 wheels      Prior Function Level of Independence: Independent               Hand Dominance        Extremity/Trunk Assessment   Upper Extremity Assessment: Overall WFL for tasks assessed           Lower Extremity Assessment: LLE deficits/detail   LLE Deficits / Details: able to perform SLR     Communication   Communication: HOH  Cognition Arousal/Alertness: Awake/alert Behavior During Therapy: WFL for tasks assessed/performed Overall Cognitive Status: Within Functional Limits for tasks assessed                      General Comments      Exercises     Assessment/Plan    PT Assessment    PT Problem List  PT Treatment Interventions      PT Goals (Current goals can be found in the Care Plan section)  Acute Rehab PT Goals Patient Stated Goal: go home, be active without pain PT Goal Formulation: With patient Potential to Achieve Goals: Good    Frequency     Barriers to discharge        Co-evaluation               End of Session Equipment Utilized During Treatment: Gait belt Activity Tolerance: Patient  tolerated treatment well Patient left: in chair;with call bell/phone within reach;with family/visitor present (in bone foam) Nurse Communication: Mobility status;Weight bearing status    Functional Assessment Tool Used: clinical judgment Functional Limitation: Mobility: Walking and moving around Mobility: Walking and Moving Around Current Status [id number]): At least 20 percent but less than 40 percent impaired, limited or restricted Mobility: Walking and Moving Around Goal Status [phone]): At least 1 percent but less than 20 percent impaired, limited or restricted    Time: [id number] PT Time Calculation (min) (ACUTE ONLY): 28 min   Charges:   PT Evaluation $PT Eval Moderate Complexity: 1 Procedure PT Treatments $Gait Training: 8-22 mins   PT G Codes:   PT G-Codes **NOT FOR INPATIENT CLASS** Functional Assessment Tool Used: clinical judgment Functional Limitation: Mobility: Walking and moving around Mobility: Walking and Moving Around Current Status [id number]): At least 20 percent but less than 40 percent impaired, limited or restricted Mobility: Walking and Moving Around Goal Status [phone]): At least 1 percent but less than 20 percent impaired, limited or restricted    Cassell Clement, PT, [place] Pager [phone] Office [phone]  08/19/2016, 2:45 PM

## 2016-08-19 NOTE — Anesthesia Procedure Notes (Signed)
Anesthesia Regional Block:  Adductor canal block  Pre-Anesthetic Checklist: ,, timeout performed, Correct Patient, Correct Site, Correct Laterality, Correct Procedure, Correct Position, site marked, Risks and benefits discussed,  Surgical consent,  Pre-op evaluation,  At surgeon's request and post-op pain management  Laterality: Left and Lower  Prep: chloraprep       Needles:   Needle Type: Echogenic Needle     Needle Length: 9cm 9 cm Needle Gauge: 22 and 22 G    Additional Needles:  Procedures: ultrasound guided (picture in chart) Adductor canal block Narrative:  Start time: 08/19/2016 7:04 AM End time: 08/19/2016 7:10 AM Injection made incrementally with aspirations every 5 mL.  Performed by: Personally  Anesthesiologist: Glennon Mac, Adil Tugwell  Additional Notes: Pt identified in Holding room.  Monitors applied. Working IV access confirmed. Sterile prep, drape L thigh.  #[id number] needle into adductor canal with US guidance.  20cc 0.75% Ropivacaine injected incrementally after negative test dose.  Patient asymptomatic, VSS, no heme aspirated, tolerated well.  Jenita Seashore, MD

## 2016-08-19 NOTE — H&P (Signed)
Vickie Love MRN:  [id number] DOB/SEX:  05/03/1946/female  CHIEF COMPLAINT:  Painful left Knee  HISTORY: Patient is a 70 y.o. female presented with a history of pain in the left knee. Onset of symptoms was gradual starting a few years ago with gradually worsening course since that time. Patient has been treated conservatively with over-the-counter NSAIDs and activity modification. Patient currently rates pain in the knee at 10 out of 10 with activity. There is pain at night.  PAST MEDICAL HISTORY: Patient Active Problem List   Diagnosis Date Noted  . DIZZINESS 03/05/2010  . PALPITATIONS 03/05/2010  . DYSPNEA 03/05/2010  . ELECTROCARDIOGRAM, ABNORMAL 03/05/2010   Past Medical History:  Diagnosis Date  . Arthritis   . Dizziness   . HOH (hard of hearing)   . Palpitations    PATIENT CANNOT REMEMBER   Past Surgical History:  Procedure Laterality Date  . BACK SURGERY  14 yrs ago   [facility]  . BREAST SURGERY     lumpectomies x4 at [facility]  . CATARACT EXTRACTION W/PHACO  10/28/2011   Procedure: CATARACT EXTRACTION PHACO AND INTRAOCULAR LENS PLACEMENT (IOC);  Surgeon: Tonny Branch;  Location: [facility];  Service: Ophthalmology;  Laterality: Right;  CDE:13.84  . EYE SURGERY  02/2009   left eye cataract removal   . KNEE ARTHROSCOPY     right  . WRIST SURGERY     LEFT      MEDICATIONS:   Prescriptions Prior to Admission  Medication Sig Dispense Refill Last Dose  . ALPRAZolam (XANAX) 1 MG tablet Take 1 mg by mouth at bedtime as needed for anxiety.     . Biotin 1000 MCG tablet Take 1,000 mcg by mouth daily.     Past Week at Unknown  . Cholecalciferol (D3-1000) 1000 units tablet Take 1,000 Units by mouth daily.     . folic acid (FOLVITE) Q000111Q MCG tablet Take 800 mcg by mouth daily.    Past Week at Unknown  . ibandronate (BONIVA) 150 MG tablet Take 1 tablet by mouth every 30 (thirty) days. 1st - 5th day of the month  12   . temazepam (RESTORIL) 30 MG capsule Take 30 mg by mouth at bedtime as needed.  Sleep aid, patient works 3rd shift    Past Week at Unknown    ALLERGIES:   Allergies  Allergen Reactions  . Latex Other (See Comments)    "Affected fingernails" after wearing Patient states that it "eats" her hands up.   . Codeine Nausea And Vomiting    REVIEW OF SYSTEMS:  A comprehensive review of systems was negative except for: Musculoskeletal: positive for arthralgias   FAMILY HISTORY:   Family History  Problem Relation Age of Onset  . Stroke Father   . Anesthesia problems Neg Hx   . Hypotension Neg Hx   . Malignant hyperthermia Neg Hx   . Pseudochol deficiency Neg Hx     SOCIAL HISTORY:   Social History  Substance Use Topics  . Smoking status: Never Smoker  . Smokeless tobacco: Not on file  . Alcohol use No     EXAMINATION:  Vital signs in last 24 hours:    There were no vitals taken for this visit.  General Appearance:    Alert, cooperative, no distress, appears stated age  Head:    Normocephalic, without obvious abnormality, atraumatic  Eyes:    PERRL, conjunctiva/corneas clear, EOM's intact, fundi    benign, both eyes  Ears:    Normal TM's and external  ear canals, both ears  Nose:   Nares normal, septum midline, mucosa normal, no drainage    or sinus tenderness  Throat:   Lips, mucosa, and tongue normal; teeth and gums normal  Neck:   Supple, symmetrical, trachea midline, no adenopathy;    thyroid:  no enlargement/tenderness/nodules; no carotid   bruit or JVD  Back:     Symmetric, no curvature, ROM normal, no CVA tenderness  Lungs:     Clear to auscultation bilaterally, respirations unlabored  Chest Wall:    No tenderness or deformity   Heart:    Regular rate and rhythm, S1 and S2 normal, no murmur, rub   or gallop  Breast Exam:    No tenderness, masses, or nipple abnormality  Abdomen:     Soft, non-tender, bowel sounds active all four quadrants,    no masses, no organomegaly  Genitalia:    Normal female without lesion, discharge or tenderness   Rectal:    Normal tone, no masses or tenderness;   guaiac negative stool  Extremities:   Extremities normal, atraumatic, no cyanosis or edema  Pulses:   2+ and symmetric all extremities  Skin:   Skin color, texture, turgor normal, no rashes or lesions  Lymph nodes:   Cervical, supraclavicular, and axillary nodes normal  Neurologic:   CNII-XII intact, normal strength, sensation and reflexes    throughout     Musculoskeletal:  ROM 0-120, Ligaments intact,  Imaging Review Plain radiographs demonstrate severe degenerative joint disease of the left knee. The overall alignment is neutral. The bone quality appears to be excellent for age and reported activity level.  Assessment/Plan: Primary osteoarthritis, left knee   The patient history, physical examination and imaging studies are consistent with advanced degenerative joint disease of the left knee. The patient has failed conservative treatment.  The clearance notes were reviewed.  After discussion with the patient it was felt that Total Knee Replacement was indicated. The procedure,  risks, and benefits of total knee arthroplasty were presented and reviewed. The risks including but not limited to aseptic loosening, infection, blood clots, vascular injury, stiffness, patella tracking problems complications among others were discussed. The patient acknowledged the explanation, agreed to proceed with the plan. Donia Ast 08/19/2016, 6:29 AM

## 2016-08-19 NOTE — Anesthesia Postprocedure Evaluation (Signed)
Anesthesia Post Note  Patient: [name]  Procedure(s) Performed: Procedure(s) (LRB): LEFT TOTAL KNEE ARTHROPLASTY (Left)  Patient location during evaluation: PACU Anesthesia Type: Spinal Level of consciousness: awake and alert, oriented and patient cooperative Pain management: pain level controlled Vital Signs Assessment: post-procedure vital signs reviewed and stable Respiratory status: spontaneous breathing, nonlabored ventilation and respiratory function stable Cardiovascular status: blood pressure returned to baseline and stable Postop Assessment: spinal receding, patient able to bend at knees and no signs of nausea or vomiting Anesthetic complications: no    Last Vitals:  Vitals:   08/19/16 1014 08/19/16 1030  BP: 119/90   Pulse: 69 73  Resp: 19 14  Temp:  36.1 C    Last Pain:  Vitals:   08/19/16 0658  TempSrc: Oral                 Kassady Laboy,E. Hesham Womac

## 2016-08-19 NOTE — Transfer of Care (Signed)
Immediate Anesthesia Transfer of Care Note  Patient: Vickie Love  Procedure(s) Performed: Procedure(s): LEFT TOTAL KNEE ARTHROPLASTY (Left)  Patient Location: PACU  Anesthesia Type:Spinal  Level of Consciousness: awake, alert  and oriented  Airway & Oxygen Therapy: Patient Spontanous Breathing and Patient connected to nasal cannula oxygen  Post-op Assessment: Report given to RN and Post -op Vital signs reviewed and stable  Post vital signs: Reviewed and stable  Last Vitals:  Vitals:   08/19/16 0927 08/19/16 0930  BP:  94/74  Pulse:  70  Resp:  14  Temp: 36.1 C     Last Pain:  Vitals:   08/19/16 0658  TempSrc: Oral      Patients Stated Pain Goal: 3 (Q000111Q Q000111Q)  Complications: No apparent anesthesia complications

## 2016-08-19 NOTE — Anesthesia Procedure Notes (Signed)
Spinal  Patient location during procedure: OR End time: 08/19/2016 7:33 AM Staffing Anesthesiologist: Annye Asa Performed: anesthesiologist  Preanesthetic Checklist Completed: patient identified, site marked, surgical consent, pre-op evaluation, timeout performed, IV checked, risks and benefits discussed and monitors and equipment checked Spinal Block Patient position: sitting Prep: ChloraPrep and site prepped and draped Patient monitoring: heart rate, cardiac monitor, continuous pulse ox and blood pressure Approach: midline Location: L2-3 Injection technique: single-shot Needle Needle type: Quincke  Needle gauge: 25 G Needle length: 9 cm Additional Notes Pt identified in Operating room.  Monitors applied. Working IV access confirmed. Sterile prep, drape lumbar spine.  1% lido local L 2,3.  #25ga Quincke into clear CSF L 2,3.  12mg  0.75% Bupivacaine with dextrose injected with asp CSF beginning and end of injection.  Patient asymptomatic, VSS, no heme aspirated, tolerated well.  Jenita Seashore, MD

## 2016-08-20 ENCOUNTER — Encounter (HOSPITAL_COMMUNITY): Payer: Self-pay | Admitting: Orthopedic Surgery

## 2016-08-20 LAB — BASIC METABOLIC PANEL
Anion gap: 3 — ABNORMAL LOW (ref 5–15)
BUN: 7 mg/dL (ref 6–20)
CHLORIDE: 108 mmol/L (ref 101–111)
CO2: 27 mmol/L (ref 22–32)
Calcium: 8.3 mg/dL — ABNORMAL LOW (ref 8.9–10.3)
Creatinine, Ser: 0.68 mg/dL (ref 0.44–1.00)
GFR calc non Af Amer: >60 mL/min (ref >60)
Glucose, Bld: 113 mg/dL — ABNORMAL HIGH (ref 65–99)
POTASSIUM: 3.4 mmol/L — AB (ref 3.5–5.1)
SODIUM: 138 mmol/L (ref 135–145)

## 2016-08-20 LAB — CBC
HEMATOCRIT: 31.7 % — AB (ref 36.0–46.0)
HEMOGLOBIN: 10.4 g/dL — AB (ref 12.0–15.0)
MCH: 30.5 pg (ref 26.0–34.0)
MCHC: 32.8 g/dL (ref 30.0–36.0)
MCV: 93 fL (ref 78.0–100.0)
Platelets: 121 10*3/uL — ABNORMAL LOW (ref 150–400)
RBC: 3.41 MIL/uL — AB (ref 3.87–5.11)
RDW: 13 % (ref 11.5–15.5)
WBC: 6.9 10*3/uL (ref 4.0–10.5)

## 2016-08-20 MED ORDER — ASPIRIN 325 MG PO TBEC: 325.0000 mg | DELAYED_RELEASE_TABLET | Freq: Two times a day (BID) | ORAL | 0 refills | Status: DC

## 2016-08-20 MED ORDER — OXYCODONE HCL 5 MG PO TABS: 5.0000 mg | ORAL_TABLET | ORAL | 0 refills | Status: DC | PRN

## 2016-08-20 MED ORDER — METHOCARBAMOL 500 MG PO TABS: 500.0000 mg | ORAL_TABLET | Freq: Four times a day (QID) | ORAL | 0 refills | Status: DC | PRN

## 2016-08-20 MED ORDER — ONDANSETRON HCL 4 MG PO TABS: 4.0000 mg | ORAL_TABLET | Freq: Four times a day (QID) | ORAL | 0 refills | Status: DC | PRN

## 2016-08-20 NOTE — Evaluation (Signed)
Occupational Therapy Evaluation/Discharge Patient Details Name: Vickie Love MRN: [id number] DOB: 09-16-1946 Today's Date: 08/20/2016    History of Present Illness Pt is a 70 y.o. female now s/p Lt TKA. PMH: dizziness, back surgery.     Clinical Impression   PTA, pt was independent with all ADL and IADL. Pt admitted and underwent the above. Currently, the pt requires min guard assist for shower transfer and LB dressing/bathing and supervision for all other ADL. Pt plans to D/C home with her husband who can provide 24 hour supervision and assistance. All education completed with pt and husband and they verbalize/demonstrate understanding. Pt does not need further OT services. Recommend 3-in-1 and D/C home with 24 hour supervision/assistance. OT signing off.    Follow Up Recommendations  No OT follow up;Supervision/Assistance - 24 hour    Equipment Recommendations  3 in 1 bedside comode       Precautions / Restrictions Precautions Precautions: Knee;Fall Precaution Booklet Issued: No Precaution Comments: Reviewed use of zero degree bone foam and no pillow under knee. Restrictions Weight Bearing Restrictions: Yes LLE Weight Bearing: Weight bearing as tolerated      Mobility Bed Mobility Overal bed mobility: Needs Assistance Bed Mobility: Supine to Sit     Supine to sit: Supervision        Transfers Overall transfer level: Needs assistance Equipment used: Rolling walker (2 wheeled) Transfers: Sit to/from Stand Sit to Stand: Supervision;Min guard         General transfer comment: Supervision for sit<>stnad from chair and toilet; min guard for shower transfer.    Balance Overall balance assessment: Needs assistance Sitting-balance support: No upper extremity supported;Feet supported Sitting balance-Leahy Scale: Good     Standing balance support: During functional activity;Single extremity supported Standing balance-Leahy Scale: Fair                               ADL Overall ADL's : Needs assistance/impaired Eating/Feeding: Supervision/ safety;Set up;Sitting   Grooming: Supervision/safety;Set up;Standing   Upper Body Bathing: Supervision/ safety;Set up;Sitting   Lower Body Bathing: Min guard;Sit to/from stand Lower Body Bathing Details (indicate cue type and reason): Min guard for dynamic sitting task to wash feet Upper Body Dressing : Supervision/safety;Set up;Sitting   Lower Body Dressing: Min guard;Sit to/from stand Lower Body Dressing Details (indicate cue type and reason): Min guard to maintain balance during dynamic sitting to thread feet. Toilet Transfer: Supervision/safety;Set up;Ambulation;RW;BSC   Toileting- Water quality scientist and Hygiene: Supervision/safety;Set up;Sit to/from stand   Tub/ Shower Transfer: Min guard;Ambulation;3 in 1;Rolling walker Tub/Shower Transfer Details (indicate cue type and reason): Min guard assist for safe placement of walker and VC's for sequencing.  Functional mobility during ADLs: Min guard General ADL Comments: Pt and husband educated on safe use of RW during ADL with specific instruction for shower transfer. Pt requires VC's for safe use of RW.     Vision Vision Assessment?: No apparent visual deficits          Pertinent Vitals/Pain Pain Assessment: 0-10 Pain Score: 7  Pain Location: L knee Pain Descriptors / Indicators: Aching;Burning;Operative site guarding;Sore Pain Intervention(s): RN gave pain meds during session;Monitored during session;Repositioned;Ice applied     Hand Dominance Right   Extremity/Trunk Assessment Upper Extremity Assessment Upper Extremity Assessment: Overall WFL for tasks assessed   Lower Extremity Assessment Lower Extremity Assessment: LLE deficits/detail LLE Deficits / Details: Decreased strength and ROM post-operatively.       Communication Communication  Communication: HOH   Cognition Arousal/Alertness: Awake/alert Behavior During Therapy: WFL  for tasks assessed/performed Overall Cognitive Status: Within Functional Limits for tasks assessed                                Home Living Family/patient expects to be discharged to:: Private residence Living Arrangements: Spouse/significant other Available Help at Discharge: Family;Available 24 hours/day Type of Home: House Home Access: Stairs to enter [employer] of Steps: 5 Entrance Stairs-Rails: Right;Left;Can reach both Home Layout: One level     Bathroom Shower/Tub: Walk-in shower;Door   [employer] Toilet: Handicapped height Bathroom Accessibility: Yes How Accessible: Accessible via walker Home Equipment: Walker - 2 wheels;Hand held shower head          Prior Functioning/Environment Level of Independence: Independent                 OT Problem List: Decreased strength;Decreased range of motion;Decreased activity tolerance;Impaired balance (sitting and/or standing);Decreased knowledge of use of DME or AE;Pain   OT Treatment/Interventions:      OT Goals(Current goals can be found in the care plan section) Acute Rehab OT Goals Patient Stated Goal: go home, be active without pain OT Goal Formulation: With patient Time For Goal Achievement: 09/03/16 Potential to Achieve Goals: Good      End of Session Equipment Utilized During Treatment: Gait belt;Rolling walker CPM Left Knee CPM Left Knee: Off Left Knee Flexion (Degrees): 60 Left Knee Extension (Degrees): 0 Nurse Communication: Other (comment) (OT signing off)  Activity Tolerance: Patient tolerated treatment well Patient left: in chair;with call bell/phone within reach   Time: [id number] OT Time Calculation (min): 35 min Charges:  OT General Charges $OT Visit: 1 Procedure OT Evaluation $OT Eval Moderate Complexity: 1 Procedure OT Treatments $Self Care/Home Management : 8-22 mins  Norman Herrlich, OTR/L [phone] 08/20/2016, 9:58 AM

## 2016-08-20 NOTE — Care Management Note (Signed)
Case Management Note  Patient Details  Name: Vickie Love MRN: [id number] Date of Birth: [birth year]  Subjective/Objective:  70 yr old female s/p left total knee arthroplasty.                  Action/Plan:  Case manager spoke with patient concerning [place] and DME needs. Patient was preoperatively setup with [facility], no changes. Patient has rolling walker at home, CPM has been delivered, 3in1 to be delivered.    Expected Discharge Date:    08/20/16              Expected Discharge Plan:  [place]  In-House Referral:     Discharge planning Services  CM Consult  Post Acute Care Choice:  Home Health Choice offered to:  Patient  DME Arranged:    DME Agency:  [facility]  HH Arranged:  PT [place]:  [facility] (now [facility])  Status of Service:  Completed, signed off  If discussed at [place] of Stay Meetings, dates discussed:    Additional Comments:  Ninfa Meeker, RN 08/20/2016, 3:24 PM

## 2016-08-20 NOTE — Progress Notes (Addendum)
Physical Therapy Treatment Patient Details Name: Vickie Love MRN: [id number] DOB: Mar 06, 1946 Today's Date: 08/20/2016    History of Present Illness Pt is a 70 y.o. female now s/p Lt TKA. PMH: dizziness, back surgery.      PT Comments    Pt performed increased gait training and reviewed HEP.  Pt awaiting d/c home.    Follow Up Recommendations  Home health PT;Supervision for mobility/OOB     Equipment Recommendations  None recommended by PT    Recommendations for Other Services       Precautions / Restrictions Precautions Precautions: Knee;Fall Precaution Booklet Issued: No Precaution Comments: Reviewed use of zero degree bone foam and no pillow under knee. Restrictions Weight Bearing Restrictions: Yes LLE Weight Bearing: Weight bearing as tolerated    Mobility  Bed Mobility Overal bed mobility: Modified Independent Bed Mobility: Sit to Supine;Supine to Sit     Supine to sit: Modified independent (Device/Increase time) Sit to supine: Modified independent (Device/Increase time)   General bed mobility comments: Good technique.    Transfers Overall transfer level: Needs assistance Equipment used: Rolling walker (2 wheeled) Transfers: Sit to/from Stand Sit to Stand: Modified independent (Device/Increase time)         General transfer comment: Good technique observed.    Ambulation/Gait Ambulation/Gait assistance: Supervision Ambulation Distance (Feet): 320 Feet Assistive device: Rolling walker (2 wheeled) Gait Pattern/deviations: Step-through pattern;Antalgic Gait velocity: decreased   General Gait Details: Encouraging knee flexion with swing phase/ heel strike with stance phase.  Cues for gait symmetry and forward gaze.     Stairs Stairs: Yes Stairs assistance: Modified independent (Device/Increase time) Stair Management: One rail Left;One rail Right;Forwards Number of Stairs: 10 General stair comments: Cues for sequencing.  R rail ascending and L rail  descending.    Wheelchair Mobility    Modified Rankin (Stroke Patients Only)       Balance Overall balance assessment: Needs assistance   Sitting balance-Leahy Scale: Good       Standing balance-Leahy Scale: Fair                      Cognition Arousal/Alertness: Awake/alert Behavior During Therapy: WFL for tasks assessed/performed Overall Cognitive Status: Within Functional Limits for tasks assessed                      Exercises Total Joint Exercises Ankle Circles/Pumps: AROM;Both;10 reps;Supine Quad Sets: AROM;Left;10 reps;Supine Towel Squeeze: AROM;Both;10 reps;Supine Short Arc Quad: AROM;10 reps;Supine;Left Heel Slides: AROM;Left;10 reps;Supine Hip ABduction/ADduction: AROM;Left;10 reps;Supine Straight Leg Raises: AROM;Left;10 reps;Supine Long Arc Quad: AROM;Left;10 reps;Seated Knee Flexion: AROM;AAROM;Left;20 reps;Seated (1x10 AROM and 1x10 AAROM ) Goniometric ROM: 81 degree flexion.  L knee.      General Comments        Pertinent Vitals/Pain Pain Assessment: 0-10 Pain Score: 4  Pain Location: L knee Pain Descriptors / Indicators: Aching;Burning;Grimacing;Guarding Pain Intervention(s): Monitored during session;Repositioned;Ice applied    Home Living                      Prior Function            PT Goals (current goals can now be found in the care plan section) Acute Rehab PT Goals Patient Stated Goal: go home, be active without pain Potential to Achieve Goals: Good Progress towards PT goals: Progressing toward goals    Frequency           PT Plan Current plan remains appropriate  Co-evaluation             End of Session Equipment Utilized During Treatment: Gait belt Activity Tolerance: Patient tolerated treatment well Patient left: with call bell/phone within reach;with family/visitor present;in bed (with ice pack and in zero degree bone foam.  )     Time: [id number] PT Time Calculation (min) (ACUTE  ONLY): 17 min  Charges: $Therapeutic Exercise: 8-22 mins                    G Codes:      Cristela Blue 2016/09/12, 3:52 PM  Governor Rooks, PTA pager [phone]

## 2016-08-20 NOTE — Progress Notes (Signed)
Orthopedic Tech Progress Note Patient Details:  Vickie Love [birth year] [id number]  Patient ID: Vickie Love, female   DOB: 03/07/1946, 70 y.o.   MRN: [id number] Applied cpm 0-60  Karolee Stamps 08/20/2016, 6:04 AM

## 2016-08-20 NOTE — Progress Notes (Signed)
SPORTS MEDICINE AND JOINT REPLACEMENT  Lara Mulch, MD    Carlyon Shadow, PA-C [address], [place], [place]  [address]                             [phone]   PROGRESS NOTE  Subjective:  negative for Chest Pain  negative for Shortness of Breath  positive for Nausea/Vomiting   negative for Calf Pain  negative for Bowel Movement   Tolerating Diet: yes         Patient reports pain as 4 on 0-10 scale.    Objective: Vital signs in last 24 hours:   Patient Vitals for the past 24 hrs:  BP Temp Temp src Pulse Resp SpO2 Height Weight  08/20/16 0604 (!) 108/52 98.3 F (36.8 C) Oral 86 17 96 % - -  08/19/16 2347 (!) 108/57 98.2 F (36.8 C) Oral 85 16 98 % - -  08/19/16 2018 117/62 97.1 F (36.2 C) Oral 77 16 97 % - -  08/19/16 1727 116/68 97.4 F (36.3 C) Oral 83 14 97 % - -  08/19/16 1506 106/67 97.7 F (36.5 C) Oral 78 - 97 % - -  08/19/16 1400 132/78 98 F (36.7 C) Oral 83 15 99 % - -  08/19/16 1300 122/76 97.8 F (36.6 C) Oral 83 15 98 % - -  08/19/16 1200 126/75 97.7 F (36.5 C) Oral 74 15 99 % - -  08/19/16 1053 122/65 97 F (36.1 C) Oral 76 - 98 % - -  08/19/16 1030 - 97 F (36.1 C) - 73 14 100 % - -  08/19/16 1014 119/90 - - 69 19 100 % - -  08/19/16 1011 - - - - 19 - - -  08/19/16 1003 - - - - 19 - - -  08/19/16 1002 - - - - 18 - - -  08/19/16 1000 111/71 - - 72 16 99 % - -  08/19/16 0930 94/74 - - 70 14 100 % - -  08/19/16 0927 - 97 F (36.1 C) - - - - - -  08/19/16 0658 123/82 97.8 F (36.6 C) Oral 70 18 98 % 5\' 5"  (1.651 m) 61.2 kg (135 lb)    @flow {1959:LAST@   Intake/Output from previous day:   10/02 0701 - 10/03 0700 In: 1000 [I.V.:1000] Out: 750 [Urine:600]   Intake/Output this shift:   No intake/output data recorded.   Intake/Output      10/02 0701 - 10/03 0700   I.V. (mL/kg) 1000 (16.3)   Total Intake(mL/kg) 1000 (16.3)   Urine (mL/kg/hr) 600 (0.4)   Blood 150 (0.1)   Total Output 750   Net +250       Urine Occurrence 1 x      LABORATORY DATA: No results for input(s): WBC, HGB, HCT, PLT in the last 168 hours. No results for input(s): NA, K, CL, CO2, BUN, CREATININE, GLUCOSE, CALCIUM in the last 168 hours. Lab Results  Component Value Date   INR 1.06 08/06/2016    Examination:  General appearance: alert, cooperative and no distress Extremities: extremities normal, atraumatic, no cyanosis or edema  Wound Exam: clean, dry, intact   Drainage:  None: wound tissue dry  Motor Exam: Quadriceps and Hamstrings Intact  Sensory Exam: Superficial Peroneal, Deep Peroneal and Tibial normal   Assessment:    1 Day Post-Op  Procedure(s) (LRB): LEFT TOTAL KNEE ARTHROPLASTY (Left)  ADDITIONAL DIAGNOSIS:  Active Problems:   S/P total knee replacement     Plan: Physical Therapy as ordered Weight Bearing as Tolerated (WBAT)  DVT Prophylaxis:  Aspirin  DISCHARGE PLAN: Home  DISCHARGE NEEDS: HHPT   Patient is doing great, anticipate D/C today         Donia Ast 08/20/2016, 6:54 AM

## 2016-08-20 NOTE — Discharge Summary (Signed)
SPORTS MEDICINE & JOINT REPLACEMENT   Vickie Mulch, MD   Carlyon Shadow, PA-C [address], [place], [place]  [address]                             [phone]  PATIENT ID: Vickie Love        MRN:  [id number]          DOB/AGE: 1946/10/12 / 70 y.o.    DISCHARGE SUMMARY  ADMISSION DATE:    08/19/2016 DISCHARGE DATE:   08/20/2016   ADMISSION DIAGNOSIS: primary osteoarthritis left knee    DISCHARGE DIAGNOSIS:  primary osteoarthritis left knee    ADDITIONAL DIAGNOSIS: Active Problems:   S/P total knee replacement  Past Medical History:  Diagnosis Date  . Arthritis   . Dizziness   . HOH (hard of hearing)   . Palpitations    PATIENT CANNOT REMEMBER    PROCEDURE: Procedure(s): LEFT TOTAL KNEE ARTHROPLASTY on 08/19/2016  CONSULTS:    HISTORY:  See H&P in chart  HOSPITAL COURSE:  Vickie Love is a 70 y.o. admitted on 08/19/2016 and found to have a diagnosis of primary osteoarthritis left knee.  After appropriate laboratory studies were obtained  they were taken to the operating room on 08/19/2016 and underwent Procedure(s): LEFT TOTAL KNEE ARTHROPLASTY.   They were given perioperative antibiotics:  Anti-infectives    Start     Dose/Rate Route Frequency Ordered Stop   08/19/16 1330  ceFAZolin (ANCEF) IVPB 1 g/50 mL premix     1 g 100 mL/hr over 30 Minutes Intravenous Every 6 hours 08/19/16 1055 08/19/16 2159   08/19/16 0715  ceFAZolin (ANCEF) IVPB 2g/100 mL premix     2 g 200 mL/hr over 30 Minutes Intravenous On call to O.R. 08/18/16 1322 08/19/16 0746    .  Patient given tranexamic acid IV or topical and exparel intra-operatively.  Tolerated the procedure well.    POD# 1: Vital signs were stable.  Patient denied Chest pain, shortness of breath, or calf pain.  Patient was started on Lovenox 30 mg subcutaneously twice daily at 8am.  Consults to PT, OT, and care management were made.  The patient was weight bearing as tolerated.  CPM was placed on the operative leg 0-90  degrees for 6-8 hours a day. When out of the CPM, patient was placed in the foam block to achieve full extension. Incentive spirometry was taught.  Dressing was changed.       POD #2, Continued  PT for ambulation and exercise program.  IV saline locked.  O2 discontinued.    The remainder of the hospital course was dedicated to ambulation and strengthening.   The patient was discharged on 1 Day Post-Op in  Good condition.  Blood products given:none  DIAGNOSTIC STUDIES: Recent vital signs: Patient Vitals for the past 24 hrs:  BP Temp Temp src Pulse Resp SpO2  08/20/16 0604 (!) 108/52 98.3 F (36.8 C) Oral 86 17 96 %  08/19/16 2347 (!) 108/57 98.2 F (36.8 C) Oral 85 16 98 %  08/19/16 2018 117/62 97.1 F (36.2 C) Oral 77 16 97 %  08/19/16 1727 116/68 97.4 F (36.3 C) Oral 83 14 97 %  08/19/16 1506 106/67 97.7 F (36.5 C) Oral 78 - 97 %       Recent laboratory studies:  Recent Labs  08/20/16 0630  WBC 6.9  HGB 10.4*  HCT 31.7*  PLT 121*  Recent Labs  08/20/16 0630  NA 138  K 3.4*  CL 108  CO2 27  BUN 7  CREATININE 0.68  GLUCOSE 113*  CALCIUM 8.3*   Lab Results  Component Value Date   INR 1.06 08/06/2016     Recent Radiographic Studies :  No results found.  DISCHARGE INSTRUCTIONS: Discharge Instructions    CPM    Complete by:  As directed    Continuous passive motion machine (CPM):      Use the CPM from 0 to 90 for 4-6 hours per day.      You may increase by 10 per day.  You may break it up into 2 or 3 sessions per day.      Use CPM for 2 weeks or until you are told to stop.   Call MD / Call 911    Complete by:  As directed    If you experience chest pain or shortness of breath, CALL 911 and be transported to the hospital emergency room.  If you develope a fever above 101 F, pus (white drainage) or increased drainage or redness at the wound, or calf pain, call your surgeon's office.   Constipation Prevention    Complete by:  As directed    Drink  plenty of fluids.  Prune juice may be helpful.  You may use a stool softener, such as Colace (over the counter) 100 mg twice a day.  Use MiraLax (over the counter) for constipation as needed.   Diet - low sodium heart healthy    Complete by:  As directed    Discharge instructions    Complete by:  As directed    INSTRUCTIONS AFTER JOINT REPLACEMENT   Remove items at home which could result in a fall. This includes throw rugs or furniture in walking pathways ICE to the affected joint every three hours while awake for 30 minutes at a time, for at least the first 3-5 days, and then as needed for pain and swelling.  Continue to use ice for pain and swelling. You may notice swelling that will progress down to the foot and ankle.  This is normal after surgery.  Elevate your leg when you are not up walking on it.   Continue to use the breathing machine you got in the hospital (incentive spirometer) which will help keep your temperature down.  It is common for your temperature to cycle up and down following surgery, especially at night when you are not up moving around and exerting yourself.  The breathing machine keeps your lungs expanded and your temperature down.   DIET:  As you were doing prior to hospitalization, we recommend a well-balanced diet.  DRESSING / WOUND CARE / SHOWERING  Keep the surgical dressing until follow up.  The dressing is water proof, so you can shower without any extra covering.  IF THE DRESSING FALLS OFF or the wound gets wet inside, change the dressing with sterile gauze.  Please use good hand washing techniques before changing the dressing.  Do not use any lotions or creams on the incision until instructed by your surgeon.    ACTIVITY  Increase activity slowly as tolerated, but follow the weight bearing instructions below.   No driving for 6 weeks or until further direction given by your physician.  You cannot drive while taking narcotics.  No lifting or carrying greater  than 10 lbs. until further directed by your surgeon. Avoid periods of inactivity such as sitting longer than an  hour when not asleep. This helps prevent blood clots.  You may return to work once you are authorized by your doctor.     WEIGHT BEARING   Weight bearing as tolerated with assist device (walker, cane, etc) as directed, use it as long as suggested by your surgeon or therapist, typically at least 4-6 weeks.   EXERCISES  Results after joint replacement surgery are often greatly improved when you follow the exercise, range of motion and muscle strengthening exercises prescribed by your doctor. Safety measures are also important to protect the joint from further injury. Any time any of these exercises cause you to have increased pain or swelling, decrease what you are doing until you are comfortable again and then slowly increase them. If you have problems or questions, call your caregiver or physical therapist for advice.   Rehabilitation is important following a joint replacement. After just a few days of immobilization, the muscles of the leg can become weakened and shrink (atrophy).  These exercises are designed to build up the tone and strength of the thigh and leg muscles and to improve motion. Often times heat used for twenty to thirty minutes before working out will loosen up your tissues and help with improving the range of motion but do not use heat for the first two weeks following surgery (sometimes heat can increase post-operative swelling).   These exercises can be done on a training (exercise) mat, on the floor, on a table or on a bed. Use whatever works the best and is most comfortable for you.    Use music or television while you are exercising so that the exercises are a pleasant break in your day. This will make your life better with the exercises acting as a break in your routine that you can look forward to.   Perform all exercises about fifteen times, three times per day  or as directed.  You should exercise both the operative leg and the other leg as well.   Exercises include:   Quad Sets - Tighten up the muscle on the front of the thigh (Quad) and hold for 5-10 seconds.   Straight Leg Raises - With your knee straight (if you were given a brace, keep it on), lift the leg to 60 degrees, hold for 3 seconds, and slowly lower the leg.  Perform this exercise against resistance later as your leg gets stronger.  Leg Slides: Lying on your back, slowly slide your foot toward your buttocks, bending your knee up off the floor (only go as far as is comfortable). Then slowly slide your foot back down until your leg is flat on the floor again.  Angel Wings: Lying on your back spread your legs to the side as far apart as you can without causing discomfort.  Hamstring Strength:  Lying on your back, push your heel against the floor with your leg straight by tightening up the muscles of your buttocks.  Repeat, but this time bend your knee to a comfortable angle, and push your heel against the floor.  You may put a pillow under the heel to make it more comfortable if necessary.   A rehabilitation program following joint replacement surgery can speed recovery and prevent re-injury in the future due to weakened muscles. Contact your doctor or a physical therapist for more information on knee rehabilitation.    CONSTIPATION  Constipation is defined medically as fewer than three stools per week and severe constipation as less than one stool per week.  Even if you have a regular bowel pattern at home, your normal regimen is likely to be disrupted due to multiple reasons following surgery.  Combination of anesthesia, postoperative narcotics, change in appetite and fluid intake all can affect your bowels.   YOU MUST use at least one of the following options; they are listed in order of increasing strength to get the job done.  They are all available over the counter, and you may need to use  some, POSSIBLY even all of these options:    Drink plenty of fluids (prune juice may be helpful) and high fiber foods Colace 100 mg by mouth twice a day  Senokot for constipation as directed and as needed Dulcolax (bisacodyl), take with full glass of water  Miralax (polyethylene glycol) once or twice a day as needed.  If you have tried all these things and are unable to have a bowel movement in the first 3-4 days after surgery call either your surgeon or your primary doctor.    If you experience loose stools or diarrhea, hold the medications until you stool forms back up.  If your symptoms do not get better within 1 week or if they get worse, check with your doctor.  If you experience "the worst abdominal pain ever" or develop nausea or vomiting, please contact the office immediately for further recommendations for treatment.   ITCHING:  If you experience itching with your medications, try taking only a single pain pill, or even half a pain pill at a time.  You can also use Benadryl over the counter for itching or also to help with sleep.   TED HOSE STOCKINGS:  Use stockings on both legs until for at least 2 weeks or as directed by physician office. They may be removed at night for sleeping.  MEDICATIONS:  See your medication summary on the "After Visit Summary" that nursing will review with you.  You may have some home medications which will be placed on hold until you complete the course of blood thinner medication.  It is important for you to complete the blood thinner medication as prescribed.  PRECAUTIONS:  If you experience chest pain or shortness of breath - call 911 immediately for transfer to the hospital emergency department.   If you develop a fever greater that 101 F, purulent drainage from wound, increased redness or drainage from wound, foul odor from the wound/dressing, or calf pain - CONTACT YOUR SURGEON.                                                   FOLLOW-UP APPOINTMENTS:   If you do not already have a post-op appointment, please call the office for an appointment to be seen by your surgeon.  Guidelines for how soon to be seen are listed in your "After Visit Summary", but are typically between 1-4 weeks after surgery.  OTHER INSTRUCTIONS:   Knee Replacement:  Do not place pillow under knee, focus on keeping the knee straight while resting. CPM instructions: 0-90 degrees, 2 hours in the morning, 2 hours in the afternoon, and 2 hours in the evening. Place foam block, curve side up under heel at all times except when in CPM or when walking.  DO NOT modify, tear, cut, or change the foam block in any way.  MAKE SURE YOU:  Understand these instructions.  Get help right away if you are not doing well or get worse.    Thank you for letting us be a part of your medical care team.  It is a privilege we respect greatly.  We hope these instructions will help you stay on track for a fast and full recovery!   Increase activity slowly as tolerated    Complete by:  As directed       DISCHARGE MEDICATIONS:     Medication List    TAKE these medications   ALPRAZolam 1 MG tablet Commonly known as:  XANAX Take 1 mg by mouth at bedtime as needed for anxiety.   aspirin 325 MG EC tablet Take 1 tablet (325 mg total) by mouth 2 (two) times daily.   Biotin 1000 MCG tablet Take 1,000 mcg by mouth daily.   D3-1000 1000 units tablet Generic drug:  Cholecalciferol Take 1,000 Units by mouth daily.   folic acid Q000111Q MCG tablet Commonly known as:  FOLVITE Take 800 mcg by mouth daily.   ibandronate 150 MG tablet Commonly known as:  BONIVA Take 1 tablet by mouth every 30 (thirty) days. 1st - 5th day of the month   methocarbamol 500 MG tablet Commonly known as:  ROBAXIN Take 1-2 tablets (500-1,000 mg total) by mouth every 6 (six) hours as needed for muscle spasms.   ondansetron 4 MG tablet Commonly known as:  ZOFRAN Take 1 tablet (4 mg total) by mouth every 6 (six) hours as  needed for nausea.   oxyCODONE 5 MG immediate release tablet Commonly known as:  Oxy IR/ROXICODONE Take 1-2 tablets (5-10 mg total) by mouth every 3 (three) hours as needed for breakthrough pain.   temazepam 30 MG capsule Commonly known as:  RESTORIL Take 30 mg by mouth at bedtime as needed. Sleep aid, patient works 3rd shift       FOLLOW UP VISIT:    DISPOSITION: HOME VS. SNF  CONDITION:  Good   Vickie Love 08/20/2016, 2:59 PM

## 2016-08-20 NOTE — Progress Notes (Signed)
Discharge Note:   Patient alert and oriented X 4 and in no distress.  Patient given discharge instructions regarding signs and symptoms to report, medication, diet, activity, and upcoming appointments.  She verbalized understanding of all instructions.  Peripheral IV discontinued.  Patient confirmed that she had all of her personal belongings.  She was transported out via wheelchair by NT.

## 2016-08-20 NOTE — Op Note (Signed)
TOTAL KNEE REPLACEMENT OPERATIVE NOTE:  08/19/2016  9:25 AM  PATIENT:  Vickie Love  70 y.o. female  PRE-OPERATIVE DIAGNOSIS:  primary osteoarthritis left knee  POST-OPERATIVE DIAGNOSIS:  primary osteoarthritis left knee  PROCEDURE:  Procedure(s): LEFT TOTAL KNEE ARTHROPLASTY  SURGEON:  Surgeon(s): Vickey Huger, MD  PHYSICIAN ASSISTANT: Carlyon Shadow, [facility]   ANESTHESIA:   spinal  DRAINS: Hemovac  SPECIMEN: None  COUNTS:  Correct  TOURNIQUET:   Total Tourniquet Time Documented: Thigh (Left) - 46 minutes Total: Thigh (Left) - 46 minutes   DICTATION:  Indication for procedure:    The patient is a 70 y.o. female who has failed conservative treatment for primary osteoarthritis left knee.  Informed consent was obtained prior to anesthesia. The risks versus benefits of the operation were explain and in a way the patient can, and did, understand.   On the implant demand matching protocol, this patient scored 9.  Therefore, this patient did" "did not receive a polyethylene insert with vitamin E which is a high demand implant.  Description of procedure:     The patient was taken to the operating room and placed under anesthesia.  The patient was positioned in the usual fashion taking care that all body parts were adequately padded and/or protected.  I foley catheter was not placed.  A tourniquet was applied and the leg prepped and draped in the usual sterile fashion.  The extremity was exsanguinated with the esmarch and tourniquet inflated to 350 mmHg.  Pre-operative range of motion was normal.  The knee was in 5 degree of mild varus.  A midline incision approximately 6-7 inches long was made with a #10 blade.  A new blade was used to make a parapatellar arthrotomy going 2-3 cm into the quadriceps tendon, over the patella, and alongside the medial aspect of the patellar tendon.  A synovectomy was then performed with the #10 blade and forceps. I then elevated the deep MCL off the  medial tibial metaphysis subperiosteally around to the semimembranosus attachment.    I everted the patella and used calipers to measure patellar thickness.  I used the reamer to ream down to appropriate thickness to recreate the native thickness.  I then removed excess bone with the rongeur and sagittal saw.  I used the appropriately sized template and drilled the three lug holes.  I then put the trial in place and measured the thickness with the calipers to ensure recreation of the native thickness.  The trial was then removed and the patella subluxed and the knee brought into flexion.  A homan retractor was place to retract and protect the patella and lateral structures.  A Z-retractor was place medially to protect the medial structures.  The extra-medullary alignment system was used to make cut the tibial articular surface perpendicular to the anamotic axis of the tibia and in 3 degrees of posterior slope.  The cut surface and alignment jig was removed.  I then used the intramedullary alignment guide to make a 6 valgus cut on the distal femur.  I then marked out the epicondylar axis on the distal femur.  The posterior condylar axis measured 3 degrees.  I then used the anterior referencing sizer and measured the femur to be a size 7.  The 4-In-1 cutting block was screwed into place in external rotation matching the posterior condylar angle, making our cuts perpendicular to the epicondylar axis.  Anterior, posterior and chamfer cuts were made with the sagittal saw.  The cutting block and  cut pieces were removed.  A lamina spreader was placed in 90 degrees of flexion.  The ACL, PCL, menisci, and posterior condylar osteophytes were removed.  A 10 mm spacer blocked was found to offer good flexion and extension gap balance after minimal in degree releasing.   The scoop retractor was then placed and the femoral finishing block was pinned in place.  The small sagittal saw was used as well as the lug drill to  finish the femur.  The block and cut surfaces were removed and the medullary canal hole filled with autograft bone from the cut pieces.  The tibia was delivered forward in deep flexion and external rotation.  A size C tray was selected and pinned into place centered on the medial 1/3 of the tibial tubercle.  The reamer and keel was used to prepare the tibia through the tray.    I then trialed with the size 7 femur, size C tibia, a 10 mm insert and the 32 patella.  I had excellent flexion/extension gap balance, excellent patella tracking.  Flexion was full and beyond 120 degrees; extension was zero.  These components were chosen and the staff opened them to me on the back table while the knee was lavaged copiously and the cement mixed.  The soft tissue was infiltrated with 60cc of exparel 1.3% through a 21 gauge needle.  I cemented in the components and removed all excess cement.  The polyethylene tibial component was snapped into place and the knee placed in extension while cement was hardening.  The capsule was infilltrated with 30cc of .25% Marcaine with epinephrine.  A hemovac was place in the joint exiting superolaterally.  A pain pump was place superomedially superficial to the arthrotomy.  Once the cement was hard, the tourniquet was let down.  Hemostasis was obtained.  The arthrotomy was closed with figure-8 #1 vicryl sutures.  The deep soft tissues were closed with #0 vicryls and the subcuticular layer closed with a running #2-0 vicryl.  The skin was reapproximated and closed with skin staples.  The wound was dressed with xeroform, 4 x4's, 2 ABD sponges, a single layer of webril and a TED stocking.   The patient was then awakened, extubated, and taken to the recovery room in stable condition.  BLOOD LOSS:  300cc DRAINS: 1 hemovac, 1 pain catheter COMPLICATIONS:  None.  PLAN OF CARE: Admit to inpatient   PATIENT DISPOSITION:  PACU - hemodynamically stable.   Delay start of Pharmacological  VTE agent (>24hrs) due to surgical blood loss or risk of bleeding:  not applicable  Please fax a copy of this op note to my office at [phone] (please only include page 1 and 2 of the Case Information op note)

## 2016-08-20 NOTE — Progress Notes (Signed)
Physical Therapy Treatment Patient Details Name: Vickie Love MRN: [id number] DOB: 1946/06/23 Today's Date: 08/20/2016    History of Present Illness Pt is a 70 y.o. female now s/p Lt TKA. PMH: dizziness, back surgery.      PT Comments    Pt performed increased mobility including stair training and HEP review in prep for d/c home.  Informed RN that patient is ready to leave at this time.    Follow Up Recommendations  Home health PT;Supervision for mobility/OOB     Equipment Recommendations  None recommended by PT    Recommendations for Other Services       Precautions / Restrictions Precautions Precautions: Knee;Fall Precaution Booklet Issued: No Precaution Comments: Reviewed use of zero degree bone foam and no pillow under knee. Restrictions Weight Bearing Restrictions: Yes LLE Weight Bearing: Weight bearing as tolerated    Mobility  Bed Mobility Overal bed mobility: Modified Independent Bed Mobility: Sit to Supine     Supine to sit: Modified independent (Device/Increase time) Sit to supine: Modified independent (Device/Increase time)   General bed mobility comments: Good technique.    Transfers Overall transfer level: Needs assistance Equipment used: Rolling walker (2 wheeled) Transfers: Sit to/from Stand Sit to Stand: Modified independent (Device/Increase time)         General transfer comment: Good technique observed.    Ambulation/Gait Ambulation/Gait assistance: Supervision Ambulation Distance (Feet): 250 Feet Assistive device: Rolling walker (2 wheeled) Gait Pattern/deviations: Step-through pattern;Trunk flexed;Antalgic Gait velocity: decreased   General Gait Details: Encouraging knee flexion with swing phase/ heel strike with stance phase.  Cues for gait symmetry and forward gaze.     Stairs Stairs: Yes Stairs assistance: Supervision Stair Management: Two rails;Forwards Number of Stairs: 10 General stair comments: Cues for sequencing.     Wheelchair Mobility    Modified Rankin (Stroke Patients Only)       Balance Overall balance assessment: Needs assistance Sitting-balance support: No upper extremity supported;Feet supported Sitting balance-Leahy Scale: Good     Standing balance support: During functional activity;Single extremity supported Standing balance-Leahy Scale: Fair                      Cognition Arousal/Alertness: Awake/alert Behavior During Therapy: WFL for tasks assessed/performed Overall Cognitive Status: Within Functional Limits for tasks assessed                      Exercises Total Joint Exercises Ankle Circles/Pumps: AROM;Both;10 reps;Supine Quad Sets: AROM;Left;10 reps;Supine Towel Squeeze: AROM;Both;10 reps;Supine Short Arc Quad: AROM;10 reps;Supine;Left Heel Slides: AROM;Left;10 reps;Supine Hip ABduction/ADduction: AROM;Left;10 reps;Supine Straight Leg Raises: AROM;Left;10 reps;Supine Long Arc Quad: AROM;Left;10 reps;Seated Knee Flexion: AROM;AAROM;Left;20 reps;Seated (1x10 AROM and 1x10 AAROM.  ) Goniometric ROM: 81 degree flexion.  L knee.      General Comments        Pertinent Vitals/Pain Pain Assessment: 0-10 Pain Score: 7  Pain Location: L knee Pain Descriptors / Indicators: Aching;Burning;Grimacing;Guarding Pain Intervention(s): Monitored during session;Repositioned;Ice applied    Home Living Family/patient expects to be discharged to:: Private residence Living Arrangements: Spouse/significant other Available Help at Discharge: Family;Available 24 hours/day Type of Home: House Home Access: Stairs to enter Entrance Stairs-Rails: Right;Left;Can reach both Home Layout: One level Home Equipment: Walker - 2 wheels;Hand held shower head      Prior Function Level of Independence: Independent          PT Goals (current goals can now be found in the care plan section) Acute Rehab PT  Goals Patient Stated Goal: go home, be active without pain Potential  to Achieve Goals: Good Progress towards PT goals: Progressing toward goals    Frequency           PT Plan Current plan remains appropriate    Co-evaluation             End of Session Equipment Utilized During Treatment: Gait belt Activity Tolerance: Patient tolerated treatment well       Time: [id number] PT Time Calculation (min) (ACUTE ONLY): 30 min  Charges:  $Gait Training: 8-22 mins $Therapeutic Exercise: 8-22 mins                    G Codes:      Cristela Blue 2016-09-08, 12:21 PM  Governor Rooks, PTA pager [phone]

## 2016-09-19 ENCOUNTER — Telehealth: Payer: Self-pay

## 2016-09-19 NOTE — Telephone Encounter (Signed)
[phone]  Patient received letter to schedule tcs

## 2016-09-27 ENCOUNTER — Other Ambulatory Visit: Payer: Self-pay

## 2016-09-27 ENCOUNTER — Telehealth: Payer: Self-pay

## 2016-09-27 NOTE — Telephone Encounter (Signed)
Triaged today.  

## 2016-09-30 ENCOUNTER — Telehealth: Payer: Self-pay | Admitting: Internal Medicine

## 2016-09-30 NOTE — Telephone Encounter (Signed)
Pt is aware I need a referral from Dr. Luan Pulling to get this colonoscopy scheduled. She has an appt with him this week and will let him know to send one.

## 2016-09-30 NOTE — Telephone Encounter (Signed)
RECALL FOR TCS °

## 2016-10-03 NOTE — Telephone Encounter (Signed)
Pt is aware and she needs a referral due to her insurance. She has appt with PCP soon and will have the referral sent over.

## 2016-10-16 NOTE — Telephone Encounter (Signed)
Pt is aware we have received the referral. She is in the process of changing insurance and she wants to wait til first of the year. Said she will call be back then.

## 2016-10-16 NOTE — Telephone Encounter (Signed)
Pt decided to wait til after the first of the year due to changing insurance. She will call me then.

## 2016-12-25 DIAGNOSIS — Z1231 Encounter for screening mammogram for malignant neoplasm of breast: Secondary | ICD-10-CM | POA: Diagnosis not present

## 2017-01-14 DIAGNOSIS — J209 Acute bronchitis, unspecified: Secondary | ICD-10-CM | POA: Diagnosis not present

## 2017-03-25 DIAGNOSIS — M1990 Unspecified osteoarthritis, unspecified site: Secondary | ICD-10-CM | POA: Diagnosis not present

## 2017-03-25 DIAGNOSIS — E782 Mixed hyperlipidemia: Secondary | ICD-10-CM | POA: Diagnosis not present

## 2017-03-25 DIAGNOSIS — M810 Age-related osteoporosis without current pathological fracture: Secondary | ICD-10-CM | POA: Diagnosis not present

## 2017-03-27 ENCOUNTER — Telehealth: Payer: Self-pay

## 2017-03-27 DIAGNOSIS — Z0000 Encounter for general adult medical examination without abnormal findings: Secondary | ICD-10-CM | POA: Diagnosis not present

## 2017-03-27 NOTE — Telephone Encounter (Signed)
[phone]  Dr Luan Pulling office called and stated to please call the patient back to try and get her in for her tcs.

## 2017-03-27 NOTE — Telephone Encounter (Signed)
LMOM to call.

## 2017-03-31 ENCOUNTER — Telehealth: Payer: Self-pay | Admitting: Internal Medicine

## 2017-03-31 ENCOUNTER — Telehealth: Payer: Self-pay

## 2017-03-31 NOTE — Telephone Encounter (Signed)
Pt is past due for her colonoscopy. She isn't having any GI problem, no blood thinners or history of heart attacks in the past 12 months. Please call [phone]

## 2017-03-31 NOTE — Telephone Encounter (Signed)
See separate triage.  

## 2017-03-31 NOTE — Telephone Encounter (Addendum)
Gastroenterology Pre-Procedure Review  Request Date: 03/31/2017 Requesting Physician: Dr. Luan Pulling  PATIENT REVIEW QUESTIONS: The patient responded to the following health history questions as indicated:    PT's last colonoscopy was 11/13/2006 by Dr. Gala Romney and it was normal.  1. Diabetes Melitis: no 2. Joint replacements in the past 12 months: YES  Left knee in 08/2016 3. Major health problems in the past 3 months: no 4. Has an artificial valve or MVP: no 5. Has a defibrillator: no 6. Has been advised in past to take antibiotics in advance of a procedure like teeth cleaning: no 7. Family history of colon cancer: no  8. Alcohol Use: occasionally a glass of wine 9. History of sleep apnea: no  10. History of coronary artery or other vascular stents placed within the last 12 months: no    MEDICATIONS & ALLERGIES:    Patient reports the following regarding taking any blood thinners:   Plavix? no Aspirin? YES Coumadin? no Brilinta? no Xarelto? no Eliquis? no Pradaxa? no Savaysa? no Effient? no  Patient confirms/reports the following medications:  Current Outpatient Prescriptions  Medication Sig Dispense Refill  . ALPRAZolam (XANAX) 1 MG tablet Take 1 mg by mouth at bedtime as needed for anxiety.    Marland Kitchen aspirin EC 81 MG tablet Take 81 mg by mouth daily.    . Biotin 1000 MCG tablet Take 1,000 mcg by mouth daily.      . Cholecalciferol (D3-1000) 1000 units tablet Take 1,000 Units by mouth daily.    . folic acid (FOLVITE) 793 MCG tablet Take 800 mcg by mouth daily.     Marland Kitchen ibandronate (BONIVA) 150 MG tablet Take 1 tablet by mouth every 30 (thirty) days. 1st - 5th day of the month  12   No current facility-administered medications for this visit.     Patient confirms/reports the following allergies:  Allergies  Allergen Reactions  . Latex Other (See Comments)    "Affected fingernails" after wearing Patient states that it "eats" her hands up.   . Codeine Nausea And Vomiting    No  orders of the defined types were placed in this encounter.   AUTHORIZATION INFORMATION Primary Insurance:   ID #:   Group #:  Pre-Cert / Auth required:  Pre-Cert / Auth #:   Secondary Insurance:   ID #:  Group #:  Pre-Cert / Auth required: Pre-Cert / Auth #:   SCHEDULE INFORMATION: Procedure has been scheduled as follows:  Date:                 Time:   Location:  This Gastroenterology Pre-Precedure Review Form is being routed to the following provider(s): R. Garfield Cornea, MD

## 2017-03-31 NOTE — Telephone Encounter (Signed)
Appropriate. 12.5 mg IV on call due to Xanax.

## 2017-04-01 NOTE — Telephone Encounter (Signed)
LMOM for a call to schedule date and time for colonoscopy.

## 2017-04-01 NOTE — Telephone Encounter (Signed)
Yes

## 2017-04-01 NOTE — Telephone Encounter (Signed)
Vickie Love, Phenergan 12.5 mg right?

## 2017-04-03 ENCOUNTER — Other Ambulatory Visit: Payer: Self-pay

## 2017-04-03 DIAGNOSIS — Z1211 Encounter for screening for malignant neoplasm of colon: Secondary | ICD-10-CM

## 2017-04-03 NOTE — Telephone Encounter (Signed)
Pt called and has been scheduled for 05/09/2017 at 11:30 AM with Dr. Gala Romney.

## 2017-04-08 MED ORDER — PEG 3350-KCL-NA BICARB-NACL 420 G PO SOLR: 4000.0000 mL | ORAL | 0 refills | Status: DC

## 2017-04-08 NOTE — Telephone Encounter (Signed)
Rx sent to the pharmacy and instructions mailed to pt.  

## 2017-04-22 NOTE — Telephone Encounter (Signed)
NO PA is needed 

## 2017-04-25 ENCOUNTER — Encounter: Payer: Self-pay | Admitting: Cardiovascular Disease

## 2017-04-25 ENCOUNTER — Ambulatory Visit (INDEPENDENT_AMBULATORY_CARE_PROVIDER_SITE_OTHER): Payer: PPO | Admitting: Cardiovascular Disease

## 2017-04-25 VITALS — BP 116/72 | HR 79 | Ht 66.0 in | Wt 134.0 lb

## 2017-04-25 DIAGNOSIS — R5383 Other fatigue: Secondary | ICD-10-CM

## 2017-04-25 DIAGNOSIS — M549 Dorsalgia, unspecified: Secondary | ICD-10-CM | POA: Diagnosis not present

## 2017-04-25 DIAGNOSIS — R55 Syncope and collapse: Secondary | ICD-10-CM

## 2017-04-25 NOTE — Patient Instructions (Addendum)
Your physician recommends that you schedule a follow-up appointment in: 6 weeks Dr Bronson Ing   Your physician has requested that you have en exercise stress myoview. For further information please visit [email or web address]. Please follow instruction sheet, as given.   Your physician recommends that you continue on your current medications as directed. Please refer to the Current Medication list given to you today.    If you need a refill on your cardiac medications before your next appointment, please call your pharmacy.    No lab work today   Thank you for [place] !

## 2017-04-25 NOTE — Progress Notes (Signed)
CARDIOLOGY CONSULT NOTE  Patient ID: Vickie Love MRN: [id number] DOB/AGE: 1946-05-11 71 y.o.  Admit date: (Not on file) Primary Physician: Sinda Du, MD Referring Physician: Luan Pulling  Reason for Consultation: near syncope  HPI: Vickie Love is a 71 y.o. female who is being seen today for the evaluation of near syncope at the request of Sinda Du, MD.   On one occasion, she was doing yard work and she experienced intrascapular pain. It was quite sharp and severe. On a second occasion last April, she was making dinner in the kitchen and had an episode of severe intrascapular plane almost causing her to pass out. She has noticed increasing levels of fatigue in the past few months. She denies chest pain, palpitations, shortness of breath, leg swelling, orthopnea, and paroxysmal nocturnal dyspnea.  While she snores her husband has not witnessed any apneic episodes.  She goes to the gym 3 days a week with her husband in [place]. She lifts weights and does cardiovascular exercises. She also walks. She underwent left total knee replacement surgery in 2017.  ECG performed in the office today which I ordered and personally interpreted demonstrates normal sinus rhythm with no ischemic ST segment or T-wave abnormalities, nor any arrhythmias.  A review of labs performed on 03/25/17 showed hemoglobin 13.9, platelets 210, sodium 140, potassium 4, BUN 12, creatinine 0.77, total cholesterol 209, triglycerides 41, HDL 94, LDL 107, TSH 1.08.  Family history: Both mother and father died of heart attacks in their early 75s. They were smokers.  Soc: Married. Lives in [place]. She does not smoke.  Allergies  Allergen Reactions  . Latex Other (See Comments)    "Affected fingernails" after wearing Patient states that it "eats" her hands up.   . Codeine Nausea And Vomiting    Current Outpatient Prescriptions  Medication Sig Dispense Refill  . ALPRAZolam (XANAX) 1 MG tablet Take 1 mg by  mouth at bedtime as needed for anxiety.    Marland Kitchen aspirin EC 81 MG tablet Take 81 mg by mouth daily.    . Biotin 1000 MCG tablet Take 1,000 mcg by mouth daily.      . Cholecalciferol (D3-1000) 1000 units tablet Take 1,000 Units by mouth daily.    . folic acid (FOLVITE) 191 MCG tablet Take 800 mcg by mouth daily.     Marland Kitchen ibandronate (BONIVA) 150 MG tablet Take 1 tablet by mouth every 30 (thirty) days. 1st - 5th day of the month  12  . polyethylene glycol-electrolytes (TRILYTE) 420 g solution Take 4,000 mLs by mouth as directed. 4000 mL 0   No current facility-administered medications for this visit.     Past Medical History:  Diagnosis Date  . Arthritis   . Dizziness   . HOH (hard of hearing)   . Palpitations    PATIENT CANNOT REMEMBER    Past Surgical History:  Procedure Laterality Date  . BACK SURGERY  14 yrs ago   [facility]  . BREAST SURGERY     lumpectomies x4 at [facility]  . CATARACT EXTRACTION W/PHACO  10/28/2011   Procedure: CATARACT EXTRACTION PHACO AND INTRAOCULAR LENS PLACEMENT (IOC);  Surgeon: Tonny Branch;  Location: [facility];  Service: Ophthalmology;  Laterality: Right;  CDE:13.84  . EYE SURGERY  02/2009   left eye cataract removal   . KNEE ARTHROSCOPY     right  . TOTAL KNEE ARTHROPLASTY Left 08/19/2016   Procedure: LEFT TOTAL KNEE ARTHROPLASTY;  Surgeon: Vickey Huger, MD;  Location: [facility];  Service: Orthopedics;  Laterality: Left;  . WRIST SURGERY     LEFT     Social History   Social History  . Marital status: Married    Spouse name: N/A  . Number of children: N/A  . Years of education: N/A   Occupational History  . Not on file.   Social History Main Topics  . Smoking status: Never Smoker  . Smokeless tobacco: Never Used  . Alcohol use No  . Drug use: No  . Sexual activity: Not on file   Other Topics Concern  . Not on file   Social History Narrative   Married with 3 daughters        Current Meds  Medication Sig  . ALPRAZolam (XANAX) 1 MG tablet Take 1 mg by  mouth at bedtime as needed for anxiety.  Marland Kitchen aspirin EC 81 MG tablet Take 81 mg by mouth daily.  . Biotin 1000 MCG tablet Take 1,000 mcg by mouth daily.    . Cholecalciferol (D3-1000) 1000 units tablet Take 1,000 Units by mouth daily.  . folic acid (FOLVITE) 026 MCG tablet Take 800 mcg by mouth daily.   Marland Kitchen ibandronate (BONIVA) 150 MG tablet Take 1 tablet by mouth every 30 (thirty) days. 1st - 5th day of the month  . polyethylene glycol-electrolytes (TRILYTE) 420 g solution Take 4,000 mLs by mouth as directed.      Review of systems complete and found to be negative unless listed above in HPI    Physical exam Blood pressure 116/72, pulse 79, height 5\' 6"  (1.676 m), weight 134 lb (60.8 kg), SpO2 91 %. General: NAD Neck: No JVD, no thyromegaly or thyroid nodule.  Lungs: Clear to auscultation bilaterally with normal respiratory effort. CV: Nondisplaced PMI. Regular rate and rhythm, normal S1/S2, no S3/S4, no murmur.  No peripheral edema.  No carotid bruit.    Abdomen: Soft, nontender, no distention.  Skin: Intact without lesions or rashes.  Neurologic: Alert and oriented x 3.  Psych: Normal affect. Extremities: No clubbing or cyanosis.  HEENT: Normal.   ECG: Most recent ECG reviewed.   Labs: Lab Results  Component Value Date/Time   K 3.4 (L) 08/20/2016 06:30 AM   BUN 7 08/20/2016 06:30 AM   CREATININE 0.68 08/20/2016 06:30 AM   ALT 14 08/06/2016 03:49 PM   HGB 10.4 (L) 08/20/2016 06:30 AM     Lipids: No results found for: LDLCALC, LDLDIRECT, CHOL, TRIG, HDL      ASSESSMENT AND PLAN:  1. Intrascapular pain, near syncope, and increasing fatigue: While symptoms are atypical, ischemic heart disease should be excluded given her family history. She takes aspirin 81 mg daily. I will proceed with a nuclear myocardial perfusion imaging study to evaluate for ischemic heart disease (exercise Myoview).   Disposition: Follow up in 6 wks.  Signed: Kate Sable, M.D.,  F.A.C.C.  04/25/2017, 1:41 PM

## 2017-05-02 ENCOUNTER — Encounter (HOSPITAL_COMMUNITY): Payer: PPO

## 2017-05-02 ENCOUNTER — Other Ambulatory Visit (HOSPITAL_COMMUNITY): Payer: PPO

## 2017-05-05 ENCOUNTER — Telehealth: Payer: Self-pay

## 2017-05-05 NOTE — Telephone Encounter (Signed)
Pt called to move her TCS from this this Friday to August 8th @ 8:30 am. I have typed out new instructions. She will just need to have her information updated before her TCS.

## 2017-05-05 NOTE — Telephone Encounter (Signed)
Thanks

## 2017-05-07 ENCOUNTER — Encounter (HOSPITAL_COMMUNITY): Payer: Self-pay

## 2017-05-07 ENCOUNTER — Encounter (HOSPITAL_COMMUNITY)
Admission: RE | Admit: 2017-05-07 | Discharge: 2017-05-07 | Disposition: A | Discharge status: Home / Self Care | Payer: PPO | Source: Ambulatory Visit | Attending: Cardiovascular Disease | Admitting: Cardiovascular Disease

## 2017-05-07 ENCOUNTER — Encounter (INDEPENDENT_AMBULATORY_CARE_PROVIDER_SITE_OTHER)
Admission: RE | Admit: 2017-05-07 | Discharge: 2017-05-07 | Disposition: A | Discharge status: Home / Self Care | Payer: PPO | Source: Ambulatory Visit | Attending: Cardiovascular Disease | Admitting: Cardiovascular Disease

## 2017-05-07 DIAGNOSIS — R5383 Other fatigue: Secondary | ICD-10-CM | POA: Diagnosis not present

## 2017-05-07 DIAGNOSIS — R55 Syncope and collapse: Secondary | ICD-10-CM

## 2017-05-07 LAB — NM MYOCAR MULTI W/SPECT W/WALL MOTION / EF
CHL CUP MPHR: 147 {beats}/min
CHL CUP NUCLEAR SSS: 1
CHL RATE OF PERCEIVED EXERTION: 15
CSEPED: 7 min
CSEPHR: 108 %
Estimated workload: 10.1 METS
Exercise duration (sec): 0 s
LV dias vol: 62 mL (ref 46–106)
LV sys vol: 19 mL
Peak HR: 160 {beats}/min
RATE: 0.35
Rest HR: 68 {beats}/min
SDS: 1
SRS: 0
TID: 0.85

## 2017-05-07 MED ORDER — REGADENOSON 0.4 MG/5ML IV SOLN
INTRAVENOUS | Status: AC
  Filled 2017-05-07: qty 5

## 2017-05-07 MED ORDER — TECHNETIUM TC 99M TETROFOSMIN IV KIT
10.0000 | PACK | Freq: Once | INTRAVENOUS | Status: AC | PRN
  Administered 2017-05-07: 10.6 via INTRAVENOUS

## 2017-05-07 MED ORDER — SODIUM CHLORIDE 0.9% FLUSH
INTRAVENOUS | Status: AC
  Administered 2017-05-07: 10 mL via INTRAVENOUS
  Filled 2017-05-07: qty 10

## 2017-05-07 MED ORDER — TECHNETIUM TC 99M TETROFOSMIN IV KIT
30.0000 | PACK | Freq: Once | INTRAVENOUS | Status: AC | PRN
  Administered 2017-05-07: 33 via INTRAVENOUS

## 2017-05-08 ENCOUNTER — Telehealth: Payer: Self-pay | Admitting: Adult Health

## 2017-05-08 NOTE — Telephone Encounter (Signed)
Pt made aware of test results

## 2017-05-08 NOTE — Telephone Encounter (Signed)
Returning call for test results.

## 2017-06-05 NOTE — Progress Notes (Signed)
Cardiology Office Note   Date:  06/06/2017   ID:  Vickie Love, Vickie Love 09-14-1946, MRN [id number]  PCP:  Sinda Du, MD  Cardiologist:  Dickie La chief complaint on file.     History of Present Illness: Vickie Love is a 71 y.o. female who presents for ongoing assessment and management of near syncope, the patient was seen by  Dr. Bronson Ing on 04/25/2017. She is diagnosed with intrascapular pain, near syncope with atypical symptoms of ischemic heart disease. She was planned for myocardial perfusion imaging study to evaluate for ischemia.  Nuclear medicine stress test was dated 05/07/2017: Study Result    Blood pressure demonstrated a normal response to exercise. Duke treadmill score of 7 constisent with low risk for major cardiac events  There was no ST segment deviation noted during stress.  The study is normal. There are no perfusion defects consistent with prior infarct or current ischemia  This is a low risk study.  The left ventricular ejection fraction is hyperdynamic (>65%).   She comes today without any complaints. She remains active and has had no recurrence of shoulder/neck pain. She is medically complaint.   Past Medical History:  Diagnosis Date  . Arthritis   . Dizziness   . HOH (hard of hearing)   . Palpitations    PATIENT CANNOT REMEMBER    Past Surgical History:  Procedure Laterality Date  . BACK SURGERY  14 yrs ago   [facility]  . BREAST SURGERY     [facility]omies x4 at [facility]  . CATARACT EXTRACTION W/PHACO  10/28/2011   Procedure: CATARACT EXTRACTION PHACO AND INTRAOCULAR LENS PLACEMENT (IOC);  Surgeon: Tonny Branch;  Location: [facility];  Service: Ophthalmology;  Laterality: Right;  CDE:13.84  . EYE SURGERY  02/2009   left eye cataract removal   . KNEE ARTHROSCOPY     right  . TOTAL KNEE ARTHROPLASTY Left 08/19/2016   Procedure: LEFT TOTAL KNEE ARTHROPLASTY;  Surgeon: Vickey Huger, MD;  Location: [facility];  Service: Orthopedics;  Laterality: Left;  . WRIST  SURGERY     LEFT      Current Outpatient Prescriptions  Medication Sig Dispense Refill  . ALPRAZolam (XANAX) 1 MG tablet Take 1 mg by mouth at bedtime as needed for anxiety.    Marland Kitchen aspirin EC 81 MG tablet Take 81 mg by mouth daily.    . Biotin 1000 MCG tablet Take 1,000 mcg by mouth daily.      . Cholecalciferol (D3-1000) 1000 units tablet Take 1,000 Units by mouth daily.    . folic acid (FOLVITE) 505 MCG tablet Take 800 mcg by mouth daily.     Marland Kitchen ibandronate (BONIVA) 150 MG tablet Take 1 tablet by mouth every 30 (thirty) days. 1st - 5th day of the month  12  . polyethylene glycol-electrolytes (TRILYTE) 420 g solution Take 4,000 mLs by mouth as directed. (Patient not taking: Reported on 06/06/2017) 4000 mL 0   No current facility-administered medications for this visit.     Allergies:   Latex and Codeine    Social History:  The patient  reports that she has never smoked. She has never used smokeless tobacco. She reports that she does not drink alcohol or use drugs.   Family History:  The patient's family history includes Stroke in her father.    ROS: All other systems are reviewed and negative. Unless otherwise mentioned in H&P    PHYSICAL EXAM: VS:  BP 116/70   Pulse 68   Ht 5'  6" (1.676 m)   Wt 136 lb 12.8 oz (62.1 kg)   SpO2 97% Comment: on room air  BMI 22.08 kg/m  , BMI Body mass index is 22.08 kg/m. GEN: Well nourished, well developed, in no acute distress  HEENT: normal  Neck: no JVD, carotid bruits, or masses Cardiac: RRR; no murmurs, rubs, or gallops,no edema  Respiratory:  Clear to auscultation bilaterally, normal work of breathing GI: soft, nontender, nondistended, + BS MS: no deformity or atrophy  Skin: warm and dry, no rash Neuro:  Strength and sensation are intact Psych: euthymic mood, full affect  Recent Labs: 08/06/2016: ALT 14 08/20/2016: BUN 7; Creatinine, Ser 0.68; Hemoglobin 10.4; Platelets 121; Potassium 3.4; Sodium 138    Lipid Panel No results  found for: CHOL, TRIG, HDL, CHOLHDL, VLDL, LDLCALC, LDLDIRECT    Wt Readings from Last 3 Encounters:  06/06/17 136 lb 12.8 oz (62.1 kg)  04/25/17 134 lb (60.8 kg)  08/19/16 135 lb (61.2 kg)    ASSESSMENT AND PLAN:  1. Shoulder/Neck Pain: NM stress test was negative for cardiac ischemia as etiology for pain. She has not had any recurrence of pain in several months. Likely subscapular musculoskeletal pain. Will see her PRN. I have explained to her that if she has new symptoms she will just need to call for appointment and we would be happy to see her.   Current medicines are reviewed at length with the patient today.    Labs/ tests ordered today include:  Phill Myron. [place] Pugh, ANP, AACC   06/06/2017 1:40 PM    [facility] [address], [place], [place] [address] Phone: [phone]; Fax: [phone]

## 2017-06-06 ENCOUNTER — Encounter: Payer: Self-pay | Admitting: Adult Health

## 2017-06-06 ENCOUNTER — Ambulatory Visit (INDEPENDENT_AMBULATORY_CARE_PROVIDER_SITE_OTHER): Payer: PPO | Admitting: Adult Health

## 2017-06-06 VITALS — BP 116/70 | HR 68 | Ht 66.0 in | Wt 136.8 lb

## 2017-06-06 DIAGNOSIS — R0789 Other chest pain: Secondary | ICD-10-CM

## 2017-06-06 NOTE — Patient Instructions (Signed)
Medication Instructions:   Your physician recommends that you continue on your current medications as directed. Please refer to the Current Medication list given to you today.  Labwork:  NONE  Testing/Procedures:  NONE  Follow-Up:  Your physician recommends that you schedule a follow-up appointment in: as needed.   Any Other Special Instructions Will Be Listed Below (If Applicable).  If you need a refill on your cardiac medications before your next appointment, please call your pharmacy. 

## 2017-06-11 ENCOUNTER — Telehealth: Payer: Self-pay

## 2017-06-11 NOTE — Telephone Encounter (Signed)
Pt called to reschedule her colonoscopy from 06/25/2017 because of going out of town. She has been rescheduled to 07/09/2017 at 10:30 Am with Dr.Rourk.  Maudie Mercury is aware in Endo. New instructions for her prep have been mailed.

## 2017-07-07 ENCOUNTER — Emergency Department (HOSPITAL_COMMUNITY)
Admission: EM | Admit: 2017-07-07 | Discharge: 2017-07-07 | Disposition: A | Discharge status: Home / Self Care | Payer: PPO | Attending: Emergency Medicine | Admitting: Emergency Medicine

## 2017-07-07 ENCOUNTER — Encounter (HOSPITAL_COMMUNITY): Payer: Self-pay | Admitting: Emergency Medicine

## 2017-07-07 DIAGNOSIS — Z7982 Long term (current) use of aspirin: Secondary | ICD-10-CM | POA: Insufficient documentation

## 2017-07-07 DIAGNOSIS — Y939 Activity, unspecified: Secondary | ICD-10-CM | POA: Insufficient documentation

## 2017-07-07 DIAGNOSIS — Z96651 Presence of right artificial knee joint: Secondary | ICD-10-CM | POA: Insufficient documentation

## 2017-07-07 DIAGNOSIS — Y929 Unspecified place or not applicable: Secondary | ICD-10-CM | POA: Insufficient documentation

## 2017-07-07 DIAGNOSIS — Y999 Unspecified external cause status: Secondary | ICD-10-CM | POA: Insufficient documentation

## 2017-07-07 DIAGNOSIS — Z91040 Latex allergy status: Secondary | ICD-10-CM | POA: Insufficient documentation

## 2017-07-07 DIAGNOSIS — W57XXXA Bitten or stung by nonvenomous insect and other nonvenomous arthropods, initial encounter: Secondary | ICD-10-CM | POA: Diagnosis not present

## 2017-07-07 DIAGNOSIS — S50862A Insect bite (nonvenomous) of left forearm, initial encounter: Secondary | ICD-10-CM | POA: Insufficient documentation

## 2017-07-07 MED ORDER — HYDROCORTISONE 1 % EX CREA
TOPICAL_CREAM | Freq: Once | CUTANEOUS | Status: AC
  Administered 2017-07-07: 1 via TOPICAL
  Filled 2017-07-07: qty 1.5

## 2017-07-07 MED ORDER — SULFAMETHOXAZOLE-TRIMETHOPRIM 800-160 MG PO TABS: 1.0000 | ORAL_TABLET | Freq: Two times a day (BID) | ORAL | 0 refills | Status: AC

## 2017-07-07 MED ORDER — TRIAMCINOLONE ACETONIDE 0.1 % EX CREA: 1.0000 "application " | TOPICAL_CREAM | Freq: Three times a day (TID) | CUTANEOUS | 0 refills | Status: DC

## 2017-07-07 NOTE — Discharge Instructions (Signed)
Apply ice packs on/off.  Take one benadryl 25 mg every 6 hrs if needed for itching.  Start the antibiotic tomorrow if the redness is worsening or if you develop swelling.  Return here for any worsening symptoms

## 2017-07-07 NOTE — ED Notes (Signed)
Pt ambulatory to waiting room. Pt verbalized understanding of discharge instructions.   

## 2017-07-07 NOTE — ED Triage Notes (Signed)
Pt states she noticed insect bite to the left elbow today.

## 2017-07-09 ENCOUNTER — Ambulatory Visit (HOSPITAL_COMMUNITY)
Admission: RE | Admit: 2017-07-09 | Discharge: 2017-07-09 | Disposition: A | Discharge status: Home / Self Care | Payer: PPO | Source: Ambulatory Visit | Attending: Internal Medicine | Admitting: Internal Medicine

## 2017-07-09 ENCOUNTER — Encounter (HOSPITAL_COMMUNITY): Payer: Self-pay | Admitting: *Deleted

## 2017-07-09 ENCOUNTER — Encounter (HOSPITAL_COMMUNITY)
Admission: RE | Disposition: A | Discharge status: Home / Self Care | Payer: Self-pay | Source: Ambulatory Visit | Attending: Internal Medicine

## 2017-07-09 DIAGNOSIS — D123 Benign neoplasm of transverse colon: Secondary | ICD-10-CM | POA: Diagnosis not present

## 2017-07-09 DIAGNOSIS — Z1211 Encounter for screening for malignant neoplasm of colon: Secondary | ICD-10-CM | POA: Diagnosis not present

## 2017-07-09 DIAGNOSIS — K5730 Diverticulosis of large intestine without perforation or abscess without bleeding: Secondary | ICD-10-CM | POA: Diagnosis not present

## 2017-07-09 DIAGNOSIS — Z1212 Encounter for screening for malignant neoplasm of rectum: Secondary | ICD-10-CM

## 2017-07-09 DIAGNOSIS — D122 Benign neoplasm of ascending colon: Secondary | ICD-10-CM | POA: Diagnosis not present

## 2017-07-09 DIAGNOSIS — Z7982 Long term (current) use of aspirin: Secondary | ICD-10-CM | POA: Insufficient documentation

## 2017-07-09 HISTORY — PX: COLONOSCOPY: SHX5424

## 2017-07-09 HISTORY — PX: POLYPECTOMY: SHX5525

## 2017-07-09 SURGERY — COLONOSCOPY
Anesthesia: Moderate Sedation

## 2017-07-09 MED ORDER — MIDAZOLAM HCL 5 MG/5ML IJ SOLN
INTRAMUSCULAR | Status: DC | PRN
Start: 2017-07-09 — End: 2017-07-09
  Administered 2017-07-09: 1 mg via INTRAVENOUS
  Administered 2017-07-09: 2 mg via INTRAVENOUS
  Administered 2017-07-09: 1 mg via INTRAVENOUS

## 2017-07-09 MED ORDER — SIMETHICONE 40 MG/0.6ML PO SUSP
ORAL | Status: DC | PRN
  Administered 2017-07-09: 10:00:00

## 2017-07-09 MED ORDER — SODIUM CHLORIDE 0.9 % IV SOLN
INTRAVENOUS | Status: DC
  Administered 2017-07-09: 10:00:00 via INTRAVENOUS

## 2017-07-09 MED ORDER — PROMETHAZINE HCL 25 MG/ML IJ SOLN
12.5000 mg | Freq: Once | INTRAMUSCULAR | Status: AC
  Administered 2017-07-09: 12.5 mg via INTRAVENOUS

## 2017-07-09 MED ORDER — PROMETHAZINE HCL 25 MG/ML IJ SOLN
INTRAMUSCULAR | Status: AC
  Filled 2017-07-09: qty 1

## 2017-07-09 MED ORDER — MEPERIDINE HCL 100 MG/ML IJ SOLN
INTRAMUSCULAR | Status: DC | PRN
  Administered 2017-07-09 (×3): 25 mg via INTRAVENOUS

## 2017-07-09 MED ORDER — ONDANSETRON HCL 4 MG/2ML IJ SOLN
INTRAMUSCULAR | Status: DC | PRN
  Administered 2017-07-09: 4 mg via INTRAVENOUS

## 2017-07-09 MED ORDER — SODIUM CHLORIDE 0.9% FLUSH
INTRAVENOUS | Status: AC
  Filled 2017-07-09: qty 10

## 2017-07-09 MED ORDER — ONDANSETRON HCL 4 MG/2ML IJ SOLN
INTRAMUSCULAR | Status: AC
  Filled 2017-07-09: qty 2

## 2017-07-09 MED ORDER — MIDAZOLAM HCL 5 MG/5ML IJ SOLN
INTRAMUSCULAR | Status: AC
  Filled 2017-07-09: qty 10

## 2017-07-09 MED ORDER — MEPERIDINE HCL 100 MG/ML IJ SOLN
INTRAMUSCULAR | Status: DC
Start: 2017-07-09 — End: 2017-07-09
  Filled 2017-07-09: qty 2

## 2017-07-09 NOTE — Discharge Instructions (Addendum)
Diverticulosis °Diverticulosis is a condition that develops when small pouches (diverticula) form in the wall of the large intestine (colon). The colon is where water is absorbed and stool is formed. The pouches form when the inside layer of the colon pushes through weak spots in the outer layers of the colon. You may have a few pouches or many of them. °What are the causes? °The cause of this condition is not known. °What increases the risk? °The following factors may make you more likely to develop this condition: °· Being older than age 60. Your risk for this condition increases with age. Diverticulosis is rare among people younger than age 30. By age 80, many people have it. °· Eating a low-fiber diet. °· Having frequent constipation. °· Being overweight. °· Not getting enough exercise. °· Smoking. °· Taking over-the-counter pain medicines, like aspirin and ibuprofen. °· Having a family history of diverticulosis. ° °What are the signs or symptoms? °In most people, there are no symptoms of this condition. If you do have symptoms, they may include: °· Bloating. °· Cramps in the abdomen. °· Constipation or diarrhea. °· Pain in the lower left side of the abdomen. ° °How is this diagnosed? °This condition is most often diagnosed during an exam for other colon problems. Because diverticulosis usually has no symptoms, it often cannot be diagnosed independently. This condition may be diagnosed by: °· Using a flexible scope to examine the colon (colonoscopy). °· Taking an X-ray of the colon after dye has been put into the colon (barium enema). °· Doing a CT scan. ° °How is this treated? °You may not need treatment for this condition if you have never developed an infection related to diverticulosis. If you have had an infection before, treatment may include: °· Eating a high-fiber diet. This may include eating more fruits, vegetables, and grains. °· Taking a fiber supplement. °· Taking a live bacteria supplement  (probiotic). °· Taking medicine to relax your colon. °· Taking antibiotic medicines. ° °Follow these instructions at home: °· Drink 6-8 glasses of water or more each day to prevent constipation. °· Try not to strain when you have a bowel movement. °· If you have had an infection before: °? Eat more fiber as directed by your health care provider or your diet and nutrition specialist (dietitian). °? Take a fiber supplement or probiotic, if your health care provider approves. °· Take over-the-counter and prescription medicines only as told by your health care provider. °· If you were prescribed an antibiotic, take it as told by your health care provider. Do not stop taking the antibiotic even if you start to feel better. °· Keep all follow-up visits as told by your health care provider. This is important. °Contact a health care provider if: °· You have pain in your abdomen. °· You have bloating. °· You have cramps. °· You have not had a bowel movement in 3 days. °Get help right away if: °· Your pain gets worse. °· Your bloating becomes very bad. °· You have a fever or chills, and your symptoms suddenly get worse. °· You vomit. °· You have bowel movements that are bloody or black. °· You have bleeding from your rectum. °Summary °· Diverticulosis is a condition that develops when small pouches (diverticula) form in the wall of the large intestine (colon). °· You may have a few pouches or many of them. °· This condition is most often diagnosed during an exam for other colon problems. °· If you have had an   infection related to diverticulosis, treatment may include increasing the fiber in your diet, taking supplements, or taking medicines. °This information is not intended to replace advice given to you by your health care provider. Make sure you discuss any questions you have with your health care provider. °Document Released: 08/01/2004 Document Revised: 09/23/2016 Document Reviewed: 09/23/2016 °Elsevier Interactive  Patient Education © 2017 [employer] °Colon Polyps °Polyps are tissue growths inside the body. Polyps can grow in many places, including the large intestine (colon). A polyp may be a round bump or a mushroom-shaped growth. You could have one polyp or several. °Most colon polyps are noncancerous (benign). However, some colon polyps can become cancerous over time. °What are the causes? °The exact cause of colon polyps is not known. °What increases the risk? °This condition is more likely to develop in people who: °· Have a family history of colon cancer or colon polyps. °· Are older than 50 or older than 45 if they are African American. °· Have inflammatory bowel disease, such as ulcerative colitis or Crohn disease. °· Are overweight. °· Smoke cigarettes. °· Do not get enough exercise. °· Drink too much alcohol. °· Eat a diet that is: °? High in fat and red meat. °? Low in fiber. °· Had childhood cancer that was treated with abdominal radiation. ° °What are the signs or symptoms? °Most polyps do not cause symptoms. If you have symptoms, they may include: °· Blood coming from your rectum when having a bowel movement. °· Blood in your stool. The stool may look dark red or black. °· A change in bowel habits, such as constipation or diarrhea. ° °How is this diagnosed? °This condition is diagnosed with a colonoscopy. This is a procedure that uses a lighted, flexible scope to look at the inside of your colon. °How is this treated? °Treatment for this condition involves removing any polyps that are found. Those polyps will then be tested for cancer. If cancer is found, your health care provider will talk to you about options for colon cancer treatment. °Follow these instructions at home: °Diet °· Eat plenty of fiber, such as fruits, vegetables, and whole grains. °· Eat foods that are high in calcium and vitamin D, such as milk, cheese, yogurt, eggs, liver, fish, and broccoli. °· Limit foods high in fat, red meats, and  processed meats, such as hot dogs, sausage, bacon, and lunch meats. °· Maintain a healthy weight, or lose weight if recommended by your health care provider. °General instructions °· Do not smoke cigarettes. °· Do not drink alcohol excessively. °· Keep all follow-up visits as told by your health care provider. This is important. This includes keeping regularly scheduled colonoscopies. Talk to your health care provider about when you need a colonoscopy. °· Exercise every day or as told by your health care provider. °Contact a health care provider if: °· You have new or worsening bleeding during a bowel movement. °· You have new or increased blood in your stool. °· You have a change in bowel habits. °· You unexpectedly lose weight. °This information is not intended to replace advice given to you by your health care provider. Make sure you discuss any questions you have with your health care provider. °Document Released: 07/31/2004 Document Revised: 04/11/2016 Document Reviewed: 09/25/2015 °Elsevier Interactive Patient Education © 2018 [employer] ° °Colonoscopy °Discharge Instructions ° °Read the instructions outlined below and refer to this sheet in the next few weeks. These discharge instructions provide you with general information on caring   for yourself after you leave the hospital. Your doctor may also give you specific instructions. While your treatment has been planned according to the most current medical practices available, unavoidable complications occasionally occur. If you have any problems or questions after discharge, call Dr. Rourk at [phone]. °ACTIVITY °· You may resume your regular activity, but move at a slower pace for the next 24 hours.  °· Take frequent rest periods for the next 24 hours.  °· Walking will help get rid of the air and reduce the bloated feeling in your belly (abdomen).  °· No driving for 24 hours (because of the medicine (anesthesia) used during the test).   °· Do not sign any  important legal documents or operate any machinery for 24 hours (because of the anesthesia used during the test).  °NUTRITION °· Drink plenty of fluids.  °· You may resume your normal diet as instructed by your doctor.  °· Begin with a light meal and progress to your normal diet. Heavy or fried foods are harder to digest and may make you feel sick to your stomach (nauseated).  °· Avoid alcoholic beverages for 24 hours or as instructed.  °MEDICATIONS °· You may resume your normal medications unless your doctor tells you otherwise.  °WHAT YOU CAN EXPECT TODAY °· Some feelings of bloating in the abdomen.  °· Passage of more gas than usual.  °· Spotting of blood in your stool or on the toilet paper.  °IF YOU HAD POLYPS REMOVED DURING THE COLONOSCOPY: °· No aspirin products for 7 days or as instructed.  °· No alcohol for 7 days or as instructed.  °· Eat a soft diet for the next 24 hours.  °FINDING OUT THE RESULTS OF YOUR TEST °Not all test results are available during your visit. If your test results are not back during the visit, make an appointment with your caregiver to find out the results. Do not assume everything is normal if you have not heard from your caregiver or the medical facility. It is important for you to follow up on all of your test results.  °SEEK IMMEDIATE MEDICAL ATTENTION IF: °· You have more than a spotting of blood in your stool.  °· Your belly is swollen (abdominal distention).  °· You are nauseated or vomiting.  °· You have a temperature over 101.  °· You have abdominal pain or discomfort that is severe or gets worse throughout the day.  ° °Colon polyp and diverticulosis information provided ° °Further recommendations to follow pending review of pathology report ° °

## 2017-07-09 NOTE — H&P (Signed)
@LOGO @   Primary Care Physician:  Sinda Du, MD Primary Gastroenterologist:  Dr. Gala Romney  Pre-Procedure History & Physical: HPI:  Vickie Love is a 71 y.o. female is here for a screening colonoscopy. No bowel symptoms. No family history of colon cancer. Negative colonoscopy about 10 years ago.  Past Medical History:  Diagnosis Date  . Arthritis   . Dizziness   . HOH (hard of hearing)   . Palpitations    PATIENT CANNOT REMEMBER    Past Surgical History:  Procedure Laterality Date  . BACK SURGERY  14 yrs ago   [facility]  . BREAST SURGERY     lumpectomies x4 at [facility]  . CATARACT EXTRACTION W/PHACO  10/28/2011   Procedure: CATARACT EXTRACTION PHACO AND INTRAOCULAR LENS PLACEMENT (IOC);  Surgeon: Tonny Branch;  Location: [facility];  Service: Ophthalmology;  Laterality: Right;  CDE:13.84  . EYE SURGERY  02/2009   left eye cataract removal   . KNEE ARTHROSCOPY     right  . TOTAL KNEE ARTHROPLASTY Left 08/19/2016   Procedure: LEFT TOTAL KNEE ARTHROPLASTY;  Surgeon: Vickey Huger, MD;  Location: [facility];  Service: Orthopedics;  Laterality: Left;  . WRIST SURGERY     LEFT     Prior to Admission medications   Medication Sig Start Date End Date Taking? Authorizing Provider  ALPRAZolam Duanne Moron) 1 MG tablet Take 1 mg by mouth at bedtime as needed for anxiety.   Yes [provider]  aspirin EC 81 MG tablet Take 81 mg by mouth daily.   Yes [provider]  Cholecalciferol (VITAMIN D3) 2000 units capsule Take 2,000 Units by mouth daily.   Yes [provider]  cyanocobalamin 2000 MCG tablet Take 2,000 mcg by mouth daily.   Yes [provider]  ibandronate (BONIVA) 150 MG tablet Take 150 mg by mouth every 30 (thirty) days. 1st - 5th day of the month 07/22/16  Yes [provider]  polyethylene glycol-electrolytes (TRILYTE) 420 g solution Take 4,000 mLs by mouth as directed. 04/08/17  Yes Vickie Love, Vickie Estimable, MD  sulfamethoxazole-trimethoprim (BACTRIM DS,SEPTRA DS)  800-160 MG tablet Take 1 tablet by mouth 2 (two) times daily. 07/07/17 07/14/17  Triplett, Tammy, PA-C  triamcinolone cream (KENALOG) 0.1 % Apply 1 application topically 3 (three) times daily. 07/07/17   Triplett, Tammy, PA-C    Allergies as of 04/03/2017 - Review Complete 03/31/2017  Allergen Reaction Noted  . Latex Other (See Comments) 10/21/2011  . Codeine Nausea And Vomiting 10/21/2011    Family History  Problem Relation Age of Onset  . Stroke Father   . Colon cancer Paternal Aunt   . Anesthesia problems Neg Hx   . Hypotension Neg Hx   . Malignant hyperthermia Neg Hx   . Pseudochol deficiency Neg Hx     Social History   Social History  . Marital status: Married    Spouse name: N/A  . Number of children: N/A  . Years of education: N/A   Occupational History  . Not on file.   Social History Main Topics  . Smoking status: Never Smoker  . Smokeless tobacco: Never Used  . Alcohol use No  . Drug use: No  . Sexual activity: Not on file   Other Topics Concern  . Not on file   Social History Narrative   Married with 3 daughters     Review of Systems: See HPI, otherwise negative ROS  Physical Exam: BP 119/76   Pulse 67   Temp 97.7 F (  36.5 C) (Oral)   Resp 15   Ht 5\' 6"  (1.676 m)   Wt 134 lb (60.8 kg)   SpO2 98%   BMI 21.63 kg/m  General:   Alert,  Well-developed, well-nourished, pleasant and cooperative in NAD Lungs:  Clear throughout to auscultation.   No wheezes, crackles, or rhonchi. No acute distress. Heart:  Regular rate and rhythm; no murmurs, clicks, rubs,  or gallops. Abdomen:  Soft, nontender and nondistended. No masses, hepatosplenomegaly or hernias noted. Normal bowel sounds, without guarding, and without rebound.    Impression/Plan: Vickie Love is now here to undergo a screening colonoscopy.  Average risk screening examination. Risks, benefits, limitations, imponderables and alternatives regarding colonoscopy have been reviewed with the patient.  Questions have been answered. All parties agreeable.         Notice:  This dictation was prepared with Dragon dictation along with smaller phrase technology. Any transcriptional errors that result from this process are unintentional and may not be corrected upon review.

## 2017-07-09 NOTE — Op Note (Addendum)
[facility] Patient Name: Vickie Love Procedure Date: 07/09/2017 10:02 AM MRN: [id number] Date of Birth: 17-Oct-1946 Attending MD: Norvel Richards , MD CSN: [id number] Age: 71 Admit Type: Outpatient Procedure:                Colonoscopy Indications:              Screening for colorectal malignant neoplasm Providers:                Norvel Richards, MD, Jeanann Lewandowsky. Gwenlyn Perking RN, RN,                            Randa Spike, Technician Referring MD:              Medicines:                Midazolam 4 mg IV, Meperidine 75 mg IV, Ondansetron                            4 mg IV, Promethazine 62.9 mg IV Complications:            No immediate complications. Estimated Blood Loss:     Estimated blood loss was minimal. Procedure:                Pre-Anesthesia Assessment:                           - Prior to the procedure, a History and Physical                            was performed, and patient medications and                            allergies were reviewed. The patient's tolerance of                            previous anesthesia was also reviewed. The risks                            and benefits of the procedure and the sedation                            options and risks were discussed with the patient.                            All questions were answered, and informed consent                            was obtained. Prior Anticoagulants: The patient has                            taken no previous anticoagulant or antiplatelet                            agents. ASA Grade Assessment: II - A patient with  mild systemic disease. After reviewing the risks                            and benefits, the patient was deemed in                            satisfactory condition to undergo the procedure.                           After obtaining informed consent, the colonoscope                            was passed under direct vision. Throughout the                  procedure, the patient's blood pressure, pulse, and                            oxygen saturations were monitored continuously. The                            EC-[id number] ([id number]) scope was introduced through                            the anus and advanced to the the cecum, identified                            by appendiceal orifice and ileocecal valve. The                            colonoscopy was performed without difficulty. The                            patient tolerated the procedure well. The quality                            of the bowel preparation was adequate. The                            ileocecal valve, appendiceal orifice, and rectum                            were photographed. The colonoscopy was performed                            without difficulty. The quality of the bowel                            preparation was adequate. Scope In: 10:29:21 AM Scope Out: 10:50:37 AM Scope Withdrawal Time: 0 hours 7 minutes 6 seconds  Total Procedure Duration: 0 hours 21 minutes 16 seconds  Findings:      The perianal and digital rectal examinations were normal.      A 4 mm polyp was found in the hepatic flexure. The polyp was sessile.       The polyp  was removed with a cold snare. Resection and retrieval were       complete. Estimated blood loss was minimal. Estimated blood loss was       minimal.      Scattered small and large-mouthed diverticula were found in the sigmoid       colon.      The exam was otherwise without abnormality on direct and retroflexion       views. Impression:               - One 4 mm polyp at the hepatic flexure, removed                            with a cold snare. Resected and retrieved.                           - Diverticulosis in the sigmoid colon.                           - The examination was otherwise normal on direct                            and retroflexion views. Moderate Sedation:      Moderate (conscious) sedation was  administered by the endoscopy nurse       and supervised by the endoscopist. The following parameters were       monitored: oxygen saturation, heart rate, blood pressure, respiratory       rate, EKG, adequacy of pulmonary ventilation, and response to care.       Total physician intraservice time was 29 minutes. Recommendation:           - Patient has a contact number available for                            emergencies. The signs and symptoms of potential                            delayed complications were discussed with the                            patient. Return to normal activities tomorrow.                            Written discharge instructions were provided to the                            patient.                           - Resume previous diet.                           - Continue present medications.                           - Await pathology results.                           -  No repeat colonoscopy due to age.                           - Return to GI clinic (date not yet determined). Procedure Code(s):        --- Professional ---                           [phone], Colonoscopy, flexible; with removal of                            tumor(s), polyp(s), or other lesion(s) by snare                            technique                           99152, Moderate sedation services provided by the                            same physician or other qualified health care                            professional performing the diagnostic or                            therapeutic service that the sedation supports,                            requiring the presence of an independent trained                            observer to assist in the monitoring of the                            patient's level of consciousness and physiological                            status; initial 15 minutes of intraservice time,                            patient age 45 years or older                            [phone], Moderate sedation services; each additional                            15 minutes intraservice time Diagnosis Code(s):        --- Professional ---                           Z12.11, Encounter for screening for malignant                            neoplasm of colon  D12.3, Benign neoplasm of transverse colon (hepatic                            flexure or splenic flexure)                           K57.30, Diverticulosis of large intestine without                            perforation or abscess without bleeding CPT copyright 2016 [employer]. All rights reserved. The codes documented in this report are preliminary and upon coder review may  be revised to meet current compliance requirements. Cristopher Estimable. Rourk, MD Norvel Richards, MD 07/09/2017 11:03:50 AM This report has been signed electronically. Number of Addenda: 0

## 2017-07-10 ENCOUNTER — Encounter: Payer: Self-pay | Admitting: Internal Medicine

## 2017-07-10 NOTE — ED Provider Notes (Signed)
[facility] DEPT Provider Note   CSN: [id number] Arrival date & time: 07/07/17  1939     History   Chief Complaint Chief Complaint  Patient presents with  . Insect Bite    HPI Vickie Love is a 71 y.o. female.  HPI  Vickie Love is a 71 y.o. female who presents to the Emergency Department complaining of insect bite to her left forearm that occurred shortly before ER arrival.  She is unsure if the bite is from insect or spider.  Family is concerned that bite may be a brown recluse bite.  Pt describes itching initially and redness with a small white blister.  Now, states the area is slightly itchy, but otherwise feels ok.  She denies fever, chills, swelling, pain or numbness of her arm.  Nothing makes it better or worse.   Past Medical History:  Diagnosis Date  . Arthritis   . Dizziness   . HOH (hard of hearing)   . Palpitations    PATIENT CANNOT REMEMBER    Patient Active Problem List   Diagnosis Date Noted  . S/P total knee replacement 08/19/2016  . DIZZINESS 03/05/2010  . PALPITATIONS 03/05/2010  . DYSPNEA 03/05/2010  . ELECTROCARDIOGRAM, ABNORMAL 03/05/2010    Past Surgical History:  Procedure Laterality Date  . BACK SURGERY  14 yrs ago   [facility]  . BREAST SURGERY     lumpectomies x4 at [facility]  . CATARACT EXTRACTION W/PHACO  10/28/2011   Procedure: CATARACT EXTRACTION PHACO AND INTRAOCULAR LENS PLACEMENT (IOC);  Surgeon: Tonny Branch;  Location: [facility];  Service: Ophthalmology;  Laterality: Right;  CDE:13.84  . EYE SURGERY  02/2009   left eye cataract removal   . KNEE ARTHROSCOPY     right  . TOTAL KNEE ARTHROPLASTY Left 08/19/2016   Procedure: LEFT TOTAL KNEE ARTHROPLASTY;  Surgeon: Vickey Huger, MD;  Location: [facility];  Service: Orthopedics;  Laterality: Left;  . WRIST SURGERY     LEFT     OB History    No data available       Home Medications    Prior to Admission medications   Medication Sig Start Date End Date Taking? Authorizing Provider  ALPRAZolam  Duanne Moron) 1 MG tablet Take 1 mg by mouth at bedtime as needed for anxiety.    [provider]  aspirin EC 81 MG tablet Take 81 mg by mouth daily.    [provider]  Cholecalciferol (VITAMIN D3) 2000 units capsule Take 2,000 Units by mouth daily.    [provider]  cyanocobalamin 2000 MCG tablet Take 2,000 mcg by mouth daily.    [provider]  ibandronate (BONIVA) 150 MG tablet Take 150 mg by mouth every 30 (thirty) days. 1st - 5th day of the month 07/22/16   [provider]  polyethylene glycol-electrolytes (TRILYTE) 420 g solution Take 4,000 mLs by mouth as directed. 04/08/17   Rourk, Cristopher Estimable, MD  sulfamethoxazole-trimethoprim (BACTRIM DS,SEPTRA DS) 800-160 MG tablet Take 1 tablet by mouth 2 (two) times daily. 07/07/17 07/14/17  Jovani Flury, PA-C  triamcinolone cream (KENALOG) 0.1 % Apply 1 application topically 3 (three) times daily. 07/07/17   Kem Parkinson, PA-C    Family History Family History  Problem Relation Age of Onset  . Stroke Father   . Colon cancer Paternal Aunt   . Anesthesia problems Neg Hx   . Hypotension Neg Hx   . Malignant hyperthermia Neg Hx   . Pseudochol deficiency Neg Hx  Social History Social History  Substance Use Topics  . Smoking status: Never Smoker  . Smokeless tobacco: Never Used  . Alcohol use No     Allergies   Latex and Codeine   Review of Systems Review of Systems  Constitutional: Negative for activity change, appetite change, chills and fever.  HENT: Negative for facial swelling, sore throat and trouble swallowing.   Respiratory: Negative for chest tightness, shortness of breath and wheezing.   Musculoskeletal: Negative for neck pain and neck stiffness.  Skin: Negative for rash and wound.       Insect bite left forearm  Neurological: Negative for dizziness, weakness, numbness and headaches.  All other systems reviewed and are negative.    Physical Exam Updated Vital Signs BP 136/88  (BP Location: Right Arm)   Pulse 67   Temp 97.9 F (36.6 C) (Oral)   Resp 16   Ht 5\' 6"  (1.676 m)   Wt 60.8 kg (134 lb)   SpO2 98%   BMI 21.63 kg/m   Physical Exam  Constitutional: She is oriented to person, place, and time. She appears well-developed and well-nourished. No distress.  HENT:  Head: Normocephalic and atraumatic.  Mouth/Throat: Oropharynx is clear and moist.  Neck: Normal range of motion. Neck supple.  Cardiovascular: Normal rate, regular rhythm and intact distal pulses.   Pulmonary/Chest: Effort normal and breath sounds normal. No respiratory distress.  Musculoskeletal: She exhibits no edema or tenderness.  Lymphadenopathy:    She has no cervical adenopathy.  Neurological: She is alert and oriented to person, place, and time. No sensory deficit. She exhibits normal muscle tone. Coordination normal.  Skin: Skin is warm. Capillary refill takes less than 2 seconds. No rash noted. No erythema.  Isolated, single erythematous macule to the proximal left forearm.  No surrounding erythema, induration or fluctuance.    Psychiatric: She has a normal mood and affect.  Nursing note and vitals reviewed.    ED Treatments / Results  Labs (all labs ordered are listed, but only abnormal results are displayed) Labs Reviewed - No data to display  EKG  EKG Interpretation None       Radiology No results found.  Procedures Procedures (including critical care time)  Medications Ordered in ED Medications  hydrocortisone cream 1 % (1 application Topical Given 07/07/17 2113)     Initial Impression / Assessment and Plan / ED Course  I have reviewed the triage vital signs and the nursing notes.  Pertinent labs & imaging results that were available during my care of the patient were reviewed by me and considered in my medical decision making (see chart for details).     Pt reassured.  Doubtful, of brown recluse bite advised symptomatic tx with benadryl and steroid cream.   I have prescribed abx and advised pt to refrain from use unless redness, swelling and/or induration develop.  Return precautions also discussed.   Final Clinical Impressions(s) / ED Diagnoses   Final diagnoses:  Insect bite, initial encounter    New Prescriptions Discharge Medication List as of 07/07/2017  9:07 PM    START taking these medications   Details  sulfamethoxazole-trimethoprim (BACTRIM DS,SEPTRA DS) 800-160 MG tablet Take 1 tablet by mouth 2 (two) times daily., Starting Mon 07/07/2017, Until Mon 07/14/2017, Print    triamcinolone cream (KENALOG) 0.1 % Apply 1 application topically 3 (three) times daily., Starting Mon 07/07/2017, Print         Vanessa [place], [place], PA-C 07/10/17 1511    Dorie Rank,  MD 07/13/17 1742

## 2017-07-11 ENCOUNTER — Encounter (HOSPITAL_COMMUNITY): Payer: Self-pay | Admitting: Internal Medicine

## 2017-07-31 IMAGING — NM NM MYOCAR MULTI W/SPECT W/WALL MOTION & EF
2 series · 12 of 12 positions shown · non-contrast
Comparison: none

[Series 1: rest · 6.51mm/px · 6 of 64 frames shown]
[frame 6/64]
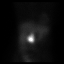
[frame 16/64]
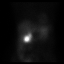
[frame 27/64]
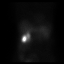
[frame 38/64]
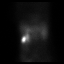
[frame 48/64]
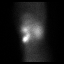
[frame 59/64]
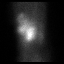

[Series 2: stress gated · 6.51mm/px · 6 of 64 frames shown]
[frame 6/64]
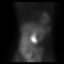
[frame 16/64]
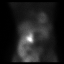
[frame 27/64]
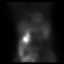
[frame 38/64]
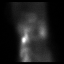
[frame 48/64]
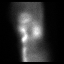
[frame 59/64]
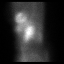

[12 of 12 positions shown; findings below may reference images not displayed]

Canned report from images found in remote index.

Refer to host system for actual result text.

## 2017-08-18 DIAGNOSIS — Z23 Encounter for immunization: Secondary | ICD-10-CM | POA: Diagnosis not present

## 2017-09-09 DIAGNOSIS — M810 Age-related osteoporosis without current pathological fracture: Secondary | ICD-10-CM | POA: Diagnosis not present

## 2017-12-08 DIAGNOSIS — F419 Anxiety disorder, unspecified: Secondary | ICD-10-CM | POA: Diagnosis not present

## 2017-12-08 DIAGNOSIS — R42 Dizziness and giddiness: Secondary | ICD-10-CM | POA: Diagnosis not present

## 2017-12-30 DIAGNOSIS — M810 Age-related osteoporosis without current pathological fracture: Secondary | ICD-10-CM | POA: Diagnosis not present

## 2017-12-30 DIAGNOSIS — Z1231 Encounter for screening mammogram for malignant neoplasm of breast: Secondary | ICD-10-CM | POA: Diagnosis not present

## 2017-12-30 DIAGNOSIS — M8589 Other specified disorders of bone density and structure, multiple sites: Secondary | ICD-10-CM | POA: Diagnosis not present

## 2018-03-24 DIAGNOSIS — H00024 Hordeolum internum left upper eyelid: Secondary | ICD-10-CM | POA: Diagnosis not present

## 2018-05-04 DIAGNOSIS — E785 Hyperlipidemia, unspecified: Secondary | ICD-10-CM | POA: Diagnosis not present

## 2018-05-04 DIAGNOSIS — M899 Disorder of bone, unspecified: Secondary | ICD-10-CM | POA: Diagnosis not present

## 2018-05-04 DIAGNOSIS — M1990 Unspecified osteoarthritis, unspecified site: Secondary | ICD-10-CM | POA: Diagnosis not present

## 2018-05-07 DIAGNOSIS — Z0000 Encounter for general adult medical examination without abnormal findings: Secondary | ICD-10-CM | POA: Diagnosis not present

## 2018-07-03 DIAGNOSIS — H5203 Hypermetropia, bilateral: Secondary | ICD-10-CM | POA: Diagnosis not present

## 2018-07-03 DIAGNOSIS — H524 Presbyopia: Secondary | ICD-10-CM | POA: Diagnosis not present

## 2018-07-03 DIAGNOSIS — H52223 Regular astigmatism, bilateral: Secondary | ICD-10-CM | POA: Diagnosis not present

## 2018-07-03 DIAGNOSIS — H04123 Dry eye syndrome of bilateral lacrimal glands: Secondary | ICD-10-CM | POA: Diagnosis not present

## 2018-09-16 DIAGNOSIS — Z23 Encounter for immunization: Secondary | ICD-10-CM | POA: Diagnosis not present

## 2018-11-23 DIAGNOSIS — N390 Urinary tract infection, site not specified: Secondary | ICD-10-CM | POA: Diagnosis not present

## 2019-01-05 DIAGNOSIS — Z1231 Encounter for screening mammogram for malignant neoplasm of breast: Secondary | ICD-10-CM | POA: Diagnosis not present

## 2019-02-25 DIAGNOSIS — L821 Other seborrheic keratosis: Secondary | ICD-10-CM | POA: Diagnosis not present

## 2019-05-06 DIAGNOSIS — E782 Mixed hyperlipidemia: Secondary | ICD-10-CM | POA: Diagnosis not present

## 2019-05-06 DIAGNOSIS — M810 Age-related osteoporosis without current pathological fracture: Secondary | ICD-10-CM | POA: Diagnosis not present

## 2019-05-06 DIAGNOSIS — M899 Disorder of bone, unspecified: Secondary | ICD-10-CM | POA: Diagnosis not present

## 2019-05-06 DIAGNOSIS — Z0000 Encounter for general adult medical examination without abnormal findings: Secondary | ICD-10-CM | POA: Diagnosis not present

## 2019-05-10 DIAGNOSIS — Z0000 Encounter for general adult medical examination without abnormal findings: Secondary | ICD-10-CM | POA: Diagnosis not present

## 2019-05-14 ENCOUNTER — Other Ambulatory Visit: Payer: Self-pay

## 2019-05-14 ENCOUNTER — Other Ambulatory Visit: Payer: Self-pay | Admitting: Internal Medicine

## 2019-05-14 DIAGNOSIS — Z20822 Contact with and (suspected) exposure to covid-19: Secondary | ICD-10-CM

## 2019-05-14 NOTE — Progress Notes (Unsigned)
lab

## 2019-05-19 LAB — NOVEL CORONAVIRUS, NAA: SARS-CoV-2, NAA: NOT DETECTED

## 2019-06-16 ENCOUNTER — Other Ambulatory Visit: Payer: Self-pay

## 2019-06-25 ENCOUNTER — Other Ambulatory Visit: Payer: Self-pay

## 2019-06-25 ENCOUNTER — Ambulatory Visit
Admission: EM | Admit: 2019-06-25 | Discharge: 2019-06-25 | Disposition: A | Discharge status: Home / Self Care | Payer: PPO | Attending: Emergency Medicine | Admitting: Emergency Medicine

## 2019-06-25 DIAGNOSIS — Z7689 Persons encountering health services in other specified circumstances: Secondary | ICD-10-CM | POA: Diagnosis not present

## 2019-06-25 DIAGNOSIS — Z711 Person with feared health complaint in whom no diagnosis is made: Secondary | ICD-10-CM | POA: Diagnosis not present

## 2019-06-25 DIAGNOSIS — Z20822 Contact with and (suspected) exposure to covid-19: Secondary | ICD-10-CM

## 2019-06-25 NOTE — ED Provider Notes (Signed)
[facility]   [id number] 06/25/19 Arrival Time: 1107   CC: Covid test  SUBJECTIVE: History from: patient.  Vickie Love is a 74 y.o. female who presents for COVID testing.  Reports recent travel by plane from [place] 5 days ago.  Denies sick exposure to COVID, flu or strep. Currently asymptomatic.  State she will be traveling next Friday to [place] to see her great grand baby and wanted to make sure she did not have COVID.   Denies previous COVID infection.  Has been tested in the past, and it was negative.   Denies fever, chills, fatigue, congestion, rhinorrhea, sore throat, cough, SOB, chest pain, chest pressure, nausea, vomiting, abdominal pain, changes in bowel or bladder habits.    ROS: As per HPI.  All other pertinent ROS negative.     Past Medical History:  Diagnosis Date  . Arthritis   . Dizziness   . HOH (hard of hearing)   . Palpitations    PATIENT CANNOT REMEMBER   Past Surgical History:  Procedure Laterality Date  . BACK SURGERY  14 yrs ago   [facility]  . BREAST SURGERY     [facility]  . CATARACT EXTRACTION W/PHACO  10/28/2011   Procedure: CATARACT EXTRACTION PHACO AND INTRAOCULAR LENS PLACEMENT (IOC);  Surgeon: Tonny Branch;  Location: [facility];  Service: Ophthalmology;  Laterality: Right;  CDE:13.84  . COLONOSCOPY N/A 07/09/2017   Procedure: COLONOSCOPY;  Surgeon: Daneil Dolin, MD;  [facility];  Service: Endoscopy;  Laterality: N/A;  11:30 AM-rescheduled 8/22 @ 10:30 per Tamela Oddi  . EYE SURGERY  02/2009   left eye cataract removal   . KNEE ARTHROSCOPY     right  . POLYPECTOMY  07/09/2017   Procedure: POLYPECTOMY;  Surgeon: Daneil Dolin, MD;  [facility];  Service: Endoscopy;;  colon  . TOTAL KNEE ARTHROPLASTY Left 08/19/2016   Procedure: LEFT TOTAL KNEE ARTHROPLASTY;  Surgeon: Vickey Huger, MD;  Location: [facility];  Service: Orthopedics;  Laterality: Left;  . WRIST SURGERY     LEFT    Allergies  Allergen Reactions  . Latex  Other (See Comments)    "Affected fingernails" after wearing Patient states that it "eats" her hands up.   . Codeine Nausea And Vomiting   No current facility-administered medications on file prior to encounter.    Current Outpatient Medications on File Prior to Encounter  Medication Sig Dispense Refill  . ALPRAZolam (XANAX) 1 MG tablet Take 1 mg by mouth at bedtime as needed for anxiety.    Marland Kitchen aspirin EC 81 MG tablet Take 81 mg by mouth daily.    . Cholecalciferol (VITAMIN D3) 2000 units capsule Take 2,000 Units by mouth daily.    . cyanocobalamin 2000 MCG tablet Take 2,000 mcg by mouth daily.    Marland Kitchen ibandronate (BONIVA) 150 MG tablet Take 150 mg by mouth every 30 (thirty) days. 1st - 5th day of the month  12  . polyethylene glycol-electrolytes (TRILYTE) 420 g solution Take 4,000 mLs by mouth as directed. 4000 mL 0  . triamcinolone cream (KENALOG) 0.1 % Apply 1 application topically 3 (three) times daily. 15 g 0   Social History   Socioeconomic History  . Marital status: Married    Spouse name: Not on file  . Number of children: Not on file  . Years of education: Not on file  . Highest education level: Not on file  Occupational History  . Not on file  Social Needs  . Financial resource strain: Not on file  . Food insecurity    Worry: Not on file    Inability: Not on file  . Transportation needs    Medical: Not on file    Non-medical: Not on file  Tobacco Use  . Smoking status: Never Smoker  . Smokeless tobacco: Never Used  Substance and Sexual Activity  . Alcohol use: No  . Drug use: No  . Sexual activity: Not on file  Lifestyle  . Physical activity    Days per week: Not on file    Minutes per session: Not on file  . Stress: Not on file  Relationships  . Social Herbalist on phone: Not on file    Gets together: Not on file    Attends religious service: Not on file    Active member of club or organization: Not on file    Attends meetings of clubs or  organizations: Not on file    Relationship status: Not on file  . Intimate partner violence    Fear of current or ex partner: Not on file    Emotionally abused: Not on file    Physically abused: Not on file    Forced sexual activity: Not on file  Other Topics Concern  . Not on file  Social History Narrative   Married with 3 daughters    Family History  Problem Relation Age of Onset  . Stroke Father   . Colon cancer Paternal Aunt   . Anesthesia problems Neg Hx   . Hypotension Neg Hx   . Malignant hyperthermia Neg Hx   . Pseudochol deficiency Neg Hx     OBJECTIVE:  Vitals:   06/25/19 1132  BP: 102/70  Pulse: 72  Resp: 18  Temp: 98.1 F (36.7 C)  SpO2: 95%     General appearance: alert; well-appearing; speaking in full sentences and tolerating own secretions HEENT: NCAT; Ears: EACs clear, TMs pearly gray; Eyes: PERRL.  EOM grossly intact. Nose: nares patent without rhinorrhea, Throat: oropharynx clear, tonsils non erythematous or enlarged, uvula midline  Neck: supple without LAD Lungs: unlabored respirations, symmetrical air entry; cough: absent; no respiratory distress; CTAB Heart: regular rate and rhythm.   Skin: warm and dry Psychological: alert and cooperative; normal mood and affect  ASSESSMENT & PLAN:  1. Physically well but worried   2. Suspected Covid-19 Virus Infection    COVID testing ordered.  It will take between 5-7 days for results to return  In the meantime: You should remain isolated in your home for 10 days from symptom onset AND greater than 72 hours after symptoms resolution (absence of fever without the use of fever-reducing medication and improvement in respiratory symptoms), whichever is longer  If you are currently not having symptoms, you should remain isolated in your home for at least 14 days from possible exposure to COVID (such as through travel) Get plenty of rest and push fluids Call or go to the ED if you have any new or worsening  symptoms such as fever, worsening cough, shortness of breath, chest tightness, chest pain, turning blue, changes in mental status, etc...   Reviewed expectations re: course of current medical issues. Questions answered. Outlined signs and symptoms indicating need for more acute intervention. Patient had to leave prior to receiving AVS paperwork, we discussed the plan above and patient was in agreement and verbalized understanding.           [place], [place],  PA-C 06/25/19 1206

## 2019-06-25 NOTE — ED Triage Notes (Signed)
Recently traveled by plane and wants testing,no symptoms

## 2019-06-25 NOTE — Discharge Instructions (Signed)
COVID testing ordered.  It will take between 5-7 days for results to return  In the meantime: You should remain isolated in your home for 10 days from symptom onset AND greater than 72 hours after symptoms resolution (absence of fever without the use of fever-reducing medication and improvement in respiratory symptoms), whichever is longer  If you are currently not having symptoms, you should remain isolated in your home for at least 14 days from possible exposure to COVID.   Get plenty of rest and push fluids Call or go to the ED if you have any new or worsening symptoms such as fever, worsening cough, shortness of breath, chest tightness, chest pain, turning blue, changes in mental status, etc..Marland Kitchen

## 2019-06-26 LAB — NOVEL CORONAVIRUS, NAA: SARS-CoV-2, NAA: NOT DETECTED

## 2019-07-22 ENCOUNTER — Other Ambulatory Visit: Payer: Self-pay

## 2019-07-22 DIAGNOSIS — U071 COVID-19: Secondary | ICD-10-CM

## 2019-07-23 LAB — NOVEL CORONAVIRUS, NAA: SARS-CoV-2, NAA: NOT DETECTED

## 2019-09-30 DIAGNOSIS — Z23 Encounter for immunization: Secondary | ICD-10-CM | POA: Diagnosis not present

## 2019-11-25 DIAGNOSIS — H35363 Drusen (degenerative) of macula, bilateral: Secondary | ICD-10-CM | POA: Diagnosis not present

## 2019-12-30 DIAGNOSIS — F411 Generalized anxiety disorder: Secondary | ICD-10-CM | POA: Diagnosis not present

## 2019-12-30 DIAGNOSIS — M810 Age-related osteoporosis without current pathological fracture: Secondary | ICD-10-CM | POA: Diagnosis not present

## 2019-12-30 DIAGNOSIS — N6019 Diffuse cystic mastopathy of unspecified breast: Secondary | ICD-10-CM | POA: Diagnosis not present

## 2019-12-30 DIAGNOSIS — K5790 Diverticulosis of intestine, part unspecified, without perforation or abscess without bleeding: Secondary | ICD-10-CM | POA: Diagnosis not present

## 2019-12-30 DIAGNOSIS — F5101 Primary insomnia: Secondary | ICD-10-CM | POA: Diagnosis not present

## 2020-01-27 DIAGNOSIS — L820 Inflamed seborrheic keratosis: Secondary | ICD-10-CM | POA: Diagnosis not present

## 2020-01-27 DIAGNOSIS — L570 Actinic keratosis: Secondary | ICD-10-CM | POA: Diagnosis not present

## 2020-01-27 DIAGNOSIS — D229 Melanocytic nevi, unspecified: Secondary | ICD-10-CM | POA: Diagnosis not present

## 2020-02-23 DIAGNOSIS — M810 Age-related osteoporosis without current pathological fracture: Secondary | ICD-10-CM | POA: Diagnosis not present

## 2020-02-23 DIAGNOSIS — Z1231 Encounter for screening mammogram for malignant neoplasm of breast: Secondary | ICD-10-CM | POA: Diagnosis not present

## 2020-02-23 DIAGNOSIS — M85851 Other specified disorders of bone density and structure, right thigh: Secondary | ICD-10-CM | POA: Diagnosis not present

## 2020-02-23 DIAGNOSIS — M85852 Other specified disorders of bone density and structure, left thigh: Secondary | ICD-10-CM | POA: Diagnosis not present

## 2020-05-23 DIAGNOSIS — E559 Vitamin D deficiency, unspecified: Secondary | ICD-10-CM | POA: Diagnosis not present

## 2020-05-23 DIAGNOSIS — M810 Age-related osteoporosis without current pathological fracture: Secondary | ICD-10-CM | POA: Diagnosis not present

## 2020-05-23 DIAGNOSIS — Z13220 Encounter for screening for lipoid disorders: Secondary | ICD-10-CM | POA: Diagnosis not present

## 2020-05-23 DIAGNOSIS — Z131 Encounter for screening for diabetes mellitus: Secondary | ICD-10-CM | POA: Diagnosis not present

## 2020-05-25 DIAGNOSIS — Z0000 Encounter for general adult medical examination without abnormal findings: Secondary | ICD-10-CM | POA: Diagnosis not present

## 2020-05-25 DIAGNOSIS — M810 Age-related osteoporosis without current pathological fracture: Secondary | ICD-10-CM | POA: Diagnosis not present

## 2020-05-25 DIAGNOSIS — F411 Generalized anxiety disorder: Secondary | ICD-10-CM | POA: Diagnosis not present

## 2020-05-25 DIAGNOSIS — F5101 Primary insomnia: Secondary | ICD-10-CM | POA: Diagnosis not present

## 2020-07-02 ENCOUNTER — Other Ambulatory Visit: Payer: Self-pay

## 2020-07-02 ENCOUNTER — Ambulatory Visit
Admission: EM | Admit: 2020-07-02 | Discharge: 2020-07-02 | Disposition: A | Discharge status: Home / Self Care | Payer: PPO | Attending: Emergency Medicine | Admitting: Emergency Medicine

## 2020-07-02 ENCOUNTER — Encounter: Payer: Self-pay | Admitting: Emergency Medicine

## 2020-07-02 DIAGNOSIS — B029 Zoster without complications: Secondary | ICD-10-CM

## 2020-07-02 MED ORDER — VALACYCLOVIR HCL 1 G PO TABS: 1000.0000 mg | ORAL_TABLET | Freq: Three times a day (TID) | ORAL | 0 refills | Status: DC

## 2020-07-02 MED ORDER — PREDNISONE 10 MG PO TABS: 20.0000 mg | ORAL_TABLET | Freq: Every day | ORAL | 0 refills | Status: DC

## 2020-07-02 MED ORDER — PREDNISONE 10 MG PO TABS: 20.0000 mg | ORAL_TABLET | Freq: Every day | ORAL | 0 refills | Status: AC

## 2020-07-02 NOTE — ED Provider Notes (Signed)
[place]   [id number] 07/02/20 Arrival Time: 6734   CC: COVID symptoms  SUBJECTIVE: History from: patient.  Vickie Love is a 74 y.o. female who presents to the urgent care for complaint of rash to chest for the past 3 days.  She denies changes in soaps, detergents, or anyone with similar symptoms.  She localizes the rash to her upper chest.she described as red and burning.  Posterior OTC Neosporin with no relief.  Denies aggravating factors.denies similar symptoms in the past.  Denies chills, fever, nausea, vomiting, diarrhea.  ROS: As per HPI.  All other pertinent ROS negative.     Past Medical History:  Diagnosis Date  . Arthritis   . Dizziness   . HOH (hard of hearing)   . Palpitations    PATIENT CANNOT REMEMBER   Past Surgical History:  Procedure Laterality Date  . BACK SURGERY  14 yrs ago   [facility]  . BREAST SURGERY     [facility] at [facility]  . CATARACT EXTRACTION W/PHACO  10/28/2011   Procedure: CATARACT EXTRACTION PHACO AND INTRAOCULAR LENS PLACEMENT (IOC);  Surgeon: Tonny Branch;  [facility];  Service: Ophthalmology;  Laterality: Right;  CDE:13.84  . COLONOSCOPY N/A 07/09/2017   Procedure: COLONOSCOPY;  Surgeon: Daneil Dolin, MD;  [facility];  Service: Endoscopy;  Laterality: N/A;  11:30 AM-rescheduled 8/22 @ 10:30 per Tamela Oddi  . EYE SURGERY  02/2009   left eye cataract removal   . KNEE ARTHROSCOPY     right  . POLYPECTOMY  07/09/2017   Procedure: POLYPECTOMY;  Surgeon: Daneil Dolin, MD;  [facility];  Service: Endoscopy;;  colon  . TOTAL KNEE ARTHROPLASTY Left 08/19/2016   Procedure: LEFT TOTAL KNEE ARTHROPLASTY;  Surgeon: Vickey Huger, MD;  Location: [facility];  Service: Orthopedics;  Laterality: Left;  . WRIST SURGERY     LEFT    Allergies  Allergen Reactions  . Latex Other (See Comments)    "Affected fingernails" after wearing Patient states that it "eats" her hands up.   . Codeine Nausea And Vomiting   No current  facility-administered medications on file prior to encounter.   Current Outpatient Medications on File Prior to Encounter  Medication Sig Dispense Refill  . ALPRAZolam (XANAX) 1 MG tablet Take 1 mg by mouth at bedtime as needed for anxiety.    Marland Kitchen aspirin EC 81 MG tablet Take 81 mg by mouth daily.    . Cholecalciferol (VITAMIN D3) 2000 units capsule Take 2,000 Units by mouth daily.    . cyanocobalamin 2000 MCG tablet Take 2,000 mcg by mouth daily.    Marland Kitchen ibandronate (BONIVA) 150 MG tablet Take 150 mg by mouth every 30 (thirty) days. 1st - 5th day of the month  12  . polyethylene glycol-electrolytes (TRILYTE) 420 g solution Take 4,000 mLs by mouth as directed. 4000 mL 0  . triamcinolone cream (KENALOG) 0.1 % Apply 1 application topically 3 (three) times daily. 15 g 0   Social History   Socioeconomic History  . Marital status: Married    Spouse name: Not on file  . Number of children: Not on file  . Years of education: Not on file  . Highest education level: Not on file  Occupational History  . Not on file  Tobacco Use  . Smoking status: Never Smoker  . Smokeless tobacco: Never Used  Vaping Use  . Vaping Use: Never used  Substance and Sexual Activity  . Alcohol use: No  .  Drug use: No  . Sexual activity: Not on file  Other Topics Concern  . Not on file  Social History Narrative   Married with 3 daughters    Social Determinants of Health   Financial Resource Strain:   . Difficulty of Paying Living Expenses:   Food Insecurity:   . Worried About Charity fundraiser in the Last Year:   . Arboriculturist in the Last Year:   Transportation Needs:   . Film/video editor (Medical):   Marland Kitchen Lack of Transportation (Non-Medical):   Physical Activity:   . Days of Exercise per Week:   . Minutes of Exercise per Session:   Stress:   . Feeling of Stress :   Social Connections:   . Frequency of Communication with Friends and Family:   . Frequency of Social Gatherings with Friends and  Family:   . Attends Religious Services:   . Active Member of Clubs or Organizations:   . Attends Archivist Meetings:   Marland Kitchen Marital Status:   Intimate Partner Violence:   . Fear of Current or Ex-Partner:   . Emotionally Abused:   Marland Kitchen Physically Abused:   . Sexually Abused:    Family History  Problem Relation Age of Onset  . Stroke Father   . Colon cancer Paternal Aunt   . Anesthesia problems Neg Hx   . Hypotension Neg Hx   . Malignant hyperthermia Neg Hx   . Pseudochol deficiency Neg Hx     OBJECTIVE:  Vitals:   07/02/20 1539  BP: 133/77  Pulse: 80  Resp: 16  Temp: 98.3 F (36.8 C)  TempSrc: Oral  SpO2: 97%     Physical Exam Vitals and nursing note reviewed.  Constitutional:      General: She is not in acute distress.    Appearance: Normal appearance. She is normal weight. She is not ill-appearing, toxic-appearing or diaphoretic.  HENT:     Head: Normocephalic.  Cardiovascular:     Rate and Rhythm: Normal rate and regular rhythm.     Pulses: Normal pulses.     Heart sounds: Normal heart sounds. No murmur heard.  No friction rub. No gallop.   Pulmonary:     Effort: Pulmonary effort is normal. No respiratory distress.     Breath sounds: Normal breath sounds. No stridor. No wheezing, rhonchi or rales.  Chest:     Chest wall: No tenderness.  Skin:    General: Skin is warm.     Findings: Rash present. Rash is macular and vesicular.     Comments: Vesicular and macular rash located on upper chest with some crusting  Neurological:     Mental Status: She is alert and oriented to person, place, and time.     LABS:  No results found for this or any previous visit (from the past 24 hour(s)).   ASSESSMENT & PLAN:  1. Herpes zoster without complication     Meds ordered this encounter  Medications  . valACYclovir (VALTREX) 1000 MG tablet    Sig: Take 1 tablet (1,000 mg total) by mouth 3 (three) times daily.    Dispense:  21 tablet    Refill:  0  .  predniSONE (DELTASONE) 10 MG tablet    Sig: Take 2 tablets (20 mg total) by mouth daily for 5 days.    Dispense:  10 tablet    Refill:  0    Discharge Instructions Rest and use ice/heat as needed for symptomatic  relief Prescribed valacyclovir 1000mg  3x/day for 10 days Prescribed prednisone taper for inflammation and pain Use OTC medications such as ibuprofen/ tylenol.  Use tramadol as needed for break-through pain Follow up with PCP in 7-10 days if rash is still present Follow up with PCP if symptoms of burning, stinging, tingling or numbness occur after rash resolves, you may need additional treatment Return here or go to ER if you have any new or worsening symptoms (such as eye involvement, severe pain, or signs of secondary infection such as fever, chills, nausea, vomiting, discharge, redness or warmth over site of rash)   Reviewed expectations re: course of current medical issues. Questions answered. Outlined signs and symptoms indicating need for more acute intervention. Patient verbalized understanding. After Visit Summary given.         Emerson Monte, FNP 07/02/20 1622

## 2020-07-02 NOTE — ED Triage Notes (Signed)
Rash to top center chest, poss shingles

## 2020-07-02 NOTE — Discharge Instructions (Addendum)
Rest and use ice/heat as needed for symptomatic relief Prescribed valacyclovir 1000mg 3x/day for 10 days Prescribed prednisone taper for inflammation and pain Use OTC medications such as ibuprofen/ tylenol.  Use tramadol as needed for break-through pain Follow up with PCP in 7-10 days if rash is still present Follow up with PCP if symptoms of burning, stinging, tingling or numbness occur after rash resolves, you may need additional treatment Return here or go to ER if you have any new or worsening symptoms (such as eye involvement, severe pain, or signs of secondary infection such as fever, chills, nausea, vomiting, discharge, redness or warmth over site of rash) 

## 2020-07-11 DIAGNOSIS — L570 Actinic keratosis: Secondary | ICD-10-CM | POA: Diagnosis not present

## 2020-07-11 DIAGNOSIS — B9689 Other specified bacterial agents as the cause of diseases classified elsewhere: Secondary | ICD-10-CM | POA: Diagnosis not present

## 2020-07-11 DIAGNOSIS — X32XXXD Exposure to sunlight, subsequent encounter: Secondary | ICD-10-CM | POA: Diagnosis not present

## 2020-07-11 DIAGNOSIS — L02223 Furuncle of chest wall: Secondary | ICD-10-CM | POA: Diagnosis not present

## 2020-08-02 DIAGNOSIS — N644 Mastodynia: Secondary | ICD-10-CM | POA: Diagnosis not present

## 2020-08-28 DIAGNOSIS — N644 Mastodynia: Secondary | ICD-10-CM | POA: Diagnosis not present

## 2020-09-29 DIAGNOSIS — F411 Generalized anxiety disorder: Secondary | ICD-10-CM | POA: Diagnosis not present

## 2020-09-29 DIAGNOSIS — Z23 Encounter for immunization: Secondary | ICD-10-CM | POA: Diagnosis not present

## 2020-09-29 DIAGNOSIS — F5101 Primary insomnia: Secondary | ICD-10-CM | POA: Diagnosis not present

## 2020-11-27 DIAGNOSIS — H04123 Dry eye syndrome of bilateral lacrimal glands: Secondary | ICD-10-CM | POA: Diagnosis not present

## 2020-12-19 ENCOUNTER — Other Ambulatory Visit: Payer: Self-pay

## 2020-12-19 ENCOUNTER — Encounter: Payer: Self-pay | Admitting: Physician Assistant

## 2020-12-19 ENCOUNTER — Ambulatory Visit: Payer: PPO | Admitting: Physician Assistant

## 2020-12-19 DIAGNOSIS — L820 Inflamed seborrheic keratosis: Secondary | ICD-10-CM

## 2020-12-19 DIAGNOSIS — L570 Actinic keratosis: Secondary | ICD-10-CM

## 2020-12-19 DIAGNOSIS — Z1283 Encounter for screening for malignant neoplasm of skin: Secondary | ICD-10-CM | POA: Diagnosis not present

## 2020-12-19 NOTE — Progress Notes (Signed)
HUSBAND WITH PATIENT HE IS HARD OF HEARING

## 2021-01-02 ENCOUNTER — Encounter: Payer: Self-pay | Admitting: Physician Assistant

## 2021-01-02 NOTE — Progress Notes (Signed)
   Follow-Up Visit   Subjective  Vickie Love is a 75 y.o. female who presents for the following: Skin Problem (FACE AND NECK SCALE +PICKS/PATIENT DONT WANT FULL BODY).   The following portions of the chart were reviewed this encounter and updated as appropriate:  Tobacco  Allergies  Meds  Problems  Med Hx  Surg Hx  Fam Hx      Objective  Well appearing patient in no apparent distress; mood and affect are within normal limits.  All skin waist up examined.  Objective  Neck - Anterior: Head, neck & back skin examination  Objective  Right Buccal Cheek, Right Malar Cheek: Erythematous patches with gritty scale.  Objective  Left Anterior Neck (3), Left Temple, Right Supraclavicular Area (2): Erythematous stuck-on, waxy papule or plaque.    Assessment & Plan  Encounter for screening for malignant neoplasm of skin Neck - Anterior  AK (actinic keratosis) (2) Right Malar Cheek; Right Buccal Cheek  Destruction of lesion - Right Buccal Cheek, Right Malar Cheek Complexity: simple   Destruction method: cryotherapy   Informed consent: discussed and consent obtained   Timeout:  patient name, date of birth, surgical site, and procedure verified Lesion destroyed using liquid nitrogen: Yes   Cryotherapy cycles:  3 Outcome: patient tolerated procedure well with no complications    Seborrheic keratosis, inflamed (6) Left Temple; Left Anterior Neck (3); Right Supraclavicular Area (2)  Destruction of lesion - Left Anterior Neck (3), Left Temple, Right Supraclavicular Area (2) Complexity: simple   Destruction method: cryotherapy   Informed consent: discussed and consent obtained   Timeout:  patient name, date of birth, surgical site, and procedure verified Lesion destroyed using liquid nitrogen: Yes   Cryotherapy cycles:  3 Outcome: patient tolerated procedure well with no complications      I, Ercel Normoyle, PA-C, have reviewed all documentation's for this visit.  The  documentation on 01/02/21 for the exam, diagnosis, procedures and orders are all accurate and complete.

## 2021-02-27 DIAGNOSIS — H04129 Dry eye syndrome of unspecified lacrimal gland: Secondary | ICD-10-CM | POA: Diagnosis not present

## 2021-02-27 DIAGNOSIS — F411 Generalized anxiety disorder: Secondary | ICD-10-CM | POA: Diagnosis not present

## 2021-02-27 DIAGNOSIS — F329 Major depressive disorder, single episode, unspecified: Secondary | ICD-10-CM | POA: Diagnosis not present

## 2021-02-27 DIAGNOSIS — Z0189 Encounter for other specified special examinations: Secondary | ICD-10-CM | POA: Diagnosis not present

## 2021-02-28 DIAGNOSIS — Z1231 Encounter for screening mammogram for malignant neoplasm of breast: Secondary | ICD-10-CM | POA: Diagnosis not present

## 2021-03-08 DIAGNOSIS — F411 Generalized anxiety disorder: Secondary | ICD-10-CM | POA: Diagnosis not present

## 2021-03-08 DIAGNOSIS — Z0189 Encounter for other specified special examinations: Secondary | ICD-10-CM | POA: Diagnosis not present

## 2021-03-08 DIAGNOSIS — F329 Major depressive disorder, single episode, unspecified: Secondary | ICD-10-CM | POA: Diagnosis not present

## 2021-03-08 DIAGNOSIS — Z712 Person consulting for explanation of examination or test findings: Secondary | ICD-10-CM | POA: Diagnosis not present

## 2021-03-08 DIAGNOSIS — H04129 Dry eye syndrome of unspecified lacrimal gland: Secondary | ICD-10-CM | POA: Diagnosis not present

## 2021-03-13 DIAGNOSIS — F329 Major depressive disorder, single episode, unspecified: Secondary | ICD-10-CM | POA: Diagnosis not present

## 2021-03-13 DIAGNOSIS — E782 Mixed hyperlipidemia: Secondary | ICD-10-CM | POA: Diagnosis not present

## 2021-03-13 DIAGNOSIS — H04129 Dry eye syndrome of unspecified lacrimal gland: Secondary | ICD-10-CM | POA: Diagnosis not present

## 2021-03-13 DIAGNOSIS — Z0189 Encounter for other specified special examinations: Secondary | ICD-10-CM | POA: Diagnosis not present

## 2021-03-13 DIAGNOSIS — F411 Generalized anxiety disorder: Secondary | ICD-10-CM | POA: Diagnosis not present

## 2021-04-25 ENCOUNTER — Other Ambulatory Visit: Payer: Self-pay

## 2021-04-25 ENCOUNTER — Ambulatory Visit
Admission: EM | Admit: 2021-04-25 | Discharge: 2021-04-25 | Disposition: A | Discharge status: Home / Self Care | Payer: PPO | Attending: Family Medicine | Admitting: Family Medicine

## 2021-04-25 ENCOUNTER — Encounter: Payer: Self-pay | Admitting: Emergency Medicine

## 2021-04-25 DIAGNOSIS — Z20822 Contact with and (suspected) exposure to covid-19: Secondary | ICD-10-CM | POA: Diagnosis not present

## 2021-04-25 DIAGNOSIS — J069 Acute upper respiratory infection, unspecified: Secondary | ICD-10-CM | POA: Diagnosis not present

## 2021-04-25 MED ORDER — MOLNUPIRAVIR EUA 200MG CAPSULE: 4.0000 | ORAL_CAPSULE | Freq: Two times a day (BID) | ORAL | 0 refills | Status: AC

## 2021-04-25 MED ORDER — PREDNISONE 10 MG (21) PO TBPK: ORAL_TABLET | Freq: Every day | ORAL | 0 refills | Status: DC

## 2021-04-25 MED ORDER — PREDNISONE 10 MG (21) PO TBPK: ORAL_TABLET | Freq: Every day | ORAL | 0 refills | Status: AC

## 2021-04-25 MED ORDER — BENZONATATE 100 MG PO CAPS: 100.0000 mg | ORAL_CAPSULE | Freq: Three times a day (TID) | ORAL | 0 refills | Status: DC

## 2021-04-25 NOTE — ED Triage Notes (Signed)
Headache, sore throat and cough, since yesterday.  Was exposed to someone with covid.  Did home covid test that was negative.

## 2021-04-25 NOTE — Discharge Instructions (Addendum)
I have sent in a prednisone taper for you to take for 6 days. 6 tablets on day one, 5 tablets on day two, 4 tablets on day three, 3 tablets on day four, 2 tablets on day five, and 1 tablet on day six.  I have sent in Mora for you to use one capsule every 8 hours as needed for cough.  Paper prescription for molnupiravir provided. Fill this only if your Covid test is positive.  Follow up with this office or with primary care if symptoms are persisting.  Follow up in the ER for high fever, trouble swallowing, trouble breathing, other concerning symptoms.

## 2021-04-26 LAB — COVID-19, FLU A+B NAA
Influenza A, NAA: NOT DETECTED
Influenza B, NAA: NOT DETECTED
SARS-CoV-2, NAA: DETECTED — AB

## 2021-04-26 NOTE — ED Provider Notes (Signed)
[facility]   [id number] 04/25/21 Arrival Time: 9935   CC: COVID symptoms  SUBJECTIVE: History from: patient.  Vickie Love is a 75 y.o. female who presents with headache, sore throat, fatigue, body aches since yesterday. Reports Covid exposure. Denies recent travel. Has negative history of Covid. Has completed Covid vaccines. Has completed flu vaccine. Has not taken OTC medications for this. There are no aggravating or alleviating factors. Denies previous symptoms in the past. Denies fever, chills, sinus pain, rhinorrhea, SOB, wheezing, chest pain, nausea, changes in bowel or bladder habits.    ROS: As per HPI.  All other pertinent ROS negative.     Past Medical History:  Diagnosis Date   Arthritis    Dizziness    HOH (hard of hearing)    Palpitations    PATIENT CANNOT REMEMBER   Past Surgical History:  Procedure Laterality Date   BACK SURGERY  14 yrs ago   [facility]   BREAST SURGERY     lumpectomies x4 at [facility]   CATARACT EXTRACTION W/PHACO  10/28/2011   Procedure: CATARACT EXTRACTION PHACO AND INTRAOCULAR LENS PLACEMENT ([place]);  Surgeon: Tonny Branch;  Location: [facility];  Service: Ophthalmology;  Laterality: Right;  CDE:13.84   COLONOSCOPY N/A 07/09/2017   Procedure: COLONOSCOPY;  Surgeon: Daneil Dolin, MD;  [facility];  Service: Endoscopy;  Laterality: N/A;  11:30 AM-rescheduled 8/22 @ 10:30 per [place]  02/2009   left eye cataract removal    KNEE ARTHROSCOPY     right   POLYPECTOMY  07/09/2017   Procedure: POLYPECTOMY;  Surgeon: Daneil Dolin, MD;  [facility]: [facility];  Service: Endoscopy;;  colon   TOTAL KNEE ARTHROPLASTY Left 08/19/2016   Procedure: LEFT TOTAL KNEE ARTHROPLASTY;  Surgeon: Vickey Huger, MD;  Location: [place];  Service: Orthopedics;  Laterality: Left;   WRIST SURGERY     LEFT    Allergies  Allergen Reactions   Latex Other (See Comments)    "Affected fingernails" after wearing Patient states that it "eats" her hands up.     Codeine Nausea And Vomiting   No current facility-administered medications on file prior to encounter.   Current Outpatient Medications on File Prior to Encounter  Medication Sig Dispense Refill   ALPRAZolam (XANAX) 1 MG tablet Take 1 mg by mouth at bedtime as needed for anxiety.     aspirin EC 81 MG tablet Take 81 mg by mouth daily.     Cholecalciferol (VITAMIN D3) 2000 units capsule Take 2,000 Units by mouth daily.     cyanocobalamin 2000 MCG tablet Take 2,000 mcg by mouth daily.     ibandronate (BONIVA) 150 MG tablet Take 150 mg by mouth every 30 (thirty) days. 1st - 5th day of the month  12   polyethylene glycol-electrolytes (TRILYTE) 420 g solution Take 4,000 mLs by mouth as directed. 4000 mL 0   triamcinolone cream (KENALOG) 0.1 % Apply 1 application topically 3 (three) times daily. 15 g 0   valACYclovir (VALTREX) 1000 MG tablet Take 1 tablet (1,000 mg total) by mouth 3 (three) times daily. 21 tablet 0   Social History   Socioeconomic History   Marital status: Married    Spouse name: Not on file   Number of children: Not on file   Years of education: Not on file   Highest education level: Not on file  Occupational History   Not on file  Tobacco Use   Smoking status: Never  Smokeless tobacco: Never  Vaping Use   Vaping Use: Never used  Substance and Sexual Activity   Alcohol use: No   Drug use: No   Sexual activity: Not on file  Other Topics Concern   Not on file  Social History Narrative   Married with 3 daughters    Social Determinants of Health   Financial Resource Strain: Not on file  Food Insecurity: Not on file  Transportation Needs: Not on file  Physical Activity: Not on file  Stress: Not on file  Social Connections: Not on file  Intimate Partner Violence: Not on file   Family History  Problem Relation Age of Onset   Stroke Father    Colon cancer Paternal Aunt    Anesthesia problems Neg Hx    Hypotension Neg Hx    Malignant hyperthermia Neg Hx     Pseudochol deficiency Neg Hx     OBJECTIVE:  Vitals:   04/25/21 0928  BP: 132/75  Pulse: 95  Resp: 16  Temp: 98.2 F (36.8 C)  TempSrc: Tympanic  SpO2: 95%     General appearance: alert; appears fatigued, but nontoxic; speaking in full sentences and tolerating own secretions HEENT: NCAT; Ears: EACs clear, TMs pearly gray; Eyes: PERRL.  EOM grossly intact. Sinuses: nontender; Nose: nares patent with clear rhinorrhea, Throat: oropharynx erythematous, cobblestoning present, tonsils non erythematous or enlarged, uvula midline  Neck: supple without LAD Lungs: unlabored respirations, symmetrical air entry; cough: mild; no respiratory distress; CTAB Heart: regular rate and rhythm.  Radial pulses 2+ symmetrical bilaterally Skin: warm and dry Psychological: alert and cooperative; normal mood and affect  LABS:  No results found for this or any previous visit (from the past 24 hour(s)).   ASSESSMENT & PLAN:  1. Viral URI with cough   2. Close exposure to COVID-19 virus     Meds ordered this encounter  Medications   DISCONTD: benzonatate (TESSALON) 100 MG capsule    Sig: Take 1 capsule (100 mg total) by mouth every 8 (eight) hours.    Dispense:  21 capsule    Refill:  0    Order Specific Question:   Supervising Provider    Answer:   Chase Picket [[id number]]   DISCONTD: predniSONE (STERAPRED UNI-PAK 21 TAB) 10 MG (21) TBPK tablet    Sig: Take by mouth daily for 6 days. Take 6 tablets on day 1, 5 tablets on day 2, 4 tablets on day 3, 3 tablets on day 4, 2 tablets on day 5, 1 tablet on day 6    Dispense:  21 tablet    Refill:  0    Order Specific Question:   Supervising Provider    Answer:   Chase Picket [[id number]]   benzonatate (TESSALON) 100 MG capsule    Sig: Take 1 capsule (100 mg total) by mouth every 8 (eight) hours.    Dispense:  21 capsule    Refill:  0    Order Specific Question:   Supervising Provider    Answer:   Chase Picket [[id number]]   predniSONE  (STERAPRED UNI-PAK 21 TAB) 10 MG (21) TBPK tablet    Sig: Take by mouth daily for 6 days. Take 6 tablets on day 1, 5 tablets on day 2, 4 tablets on day 3, 3 tablets on day 4, 2 tablets on day 5, 1 tablet on day 6    Dispense:  21 tablet    Refill:  0    Order Specific Question:  Supervising Provider    Answer:   Chase Picket [[id number]]   molnupiravir EUA 200 mg CAPS    Sig: Take 4 capsules (800 mg total) by mouth 2 (two) times daily for 5 days.    Dispense:  40 capsule    Refill:  0    Order Specific Question:   Supervising Provider    Answer:   Chase Picket [id number]   Prescribed tessalon perles Prescribed steroid taper Paper rx for molnupiravir provided in case of positive Covid Continue supportive care at home COVID and flu testing ordered.  It will take between 2-3 days for test results. Someone will contact you regarding abnormal results.   Patient should remain in quarantine until they have received Covid results.  If negative you may resume normal activities (go back to work/school) while practicing hand hygiene, social distance, and mask wearing.  If positive, patient should remain in quarantine for at least 5 days from symptom onset AND greater than 72 hours after symptoms resolution (absence of fever without the use of fever-reducing medication and improvement in respiratory symptoms), whichever is longer Get plenty of rest and push fluids Use OTC zyrtec for nasal congestion, runny nose, and/or sore throat Use OTC flonase for nasal congestion and runny nose Use medications daily for symptom relief Use OTC medications like ibuprofen or tylenol as needed fever or pain Call or go to the ED if you have any new or worsening symptoms such as fever, worsening cough, shortness of breath, chest tightness, chest pain, turning blue, changes in mental status.  Reviewed expectations re: course of current medical issues. Questions answered. Outlined signs and symptoms indicating need  for more acute intervention. Patient verbalized understanding. After Visit Summary given.          Faustino Congress, NP 04/26/21 1050

## 2021-05-29 DIAGNOSIS — Z131 Encounter for screening for diabetes mellitus: Secondary | ICD-10-CM | POA: Diagnosis not present

## 2021-05-29 DIAGNOSIS — E782 Mixed hyperlipidemia: Secondary | ICD-10-CM | POA: Diagnosis not present

## 2021-06-05 DIAGNOSIS — F411 Generalized anxiety disorder: Secondary | ICD-10-CM | POA: Diagnosis not present

## 2021-06-05 DIAGNOSIS — E782 Mixed hyperlipidemia: Secondary | ICD-10-CM | POA: Diagnosis not present

## 2021-06-05 DIAGNOSIS — R7301 Impaired fasting glucose: Secondary | ICD-10-CM | POA: Diagnosis not present

## 2021-06-05 DIAGNOSIS — H04122 Dry eye syndrome of left lacrimal gland: Secondary | ICD-10-CM | POA: Diagnosis not present

## 2021-06-05 DIAGNOSIS — H04121 Dry eye syndrome of right lacrimal gland: Secondary | ICD-10-CM | POA: Diagnosis not present

## 2021-06-05 DIAGNOSIS — F329 Major depressive disorder, single episode, unspecified: Secondary | ICD-10-CM | POA: Diagnosis not present

## 2021-06-05 DIAGNOSIS — D72819 Decreased white blood cell count, unspecified: Secondary | ICD-10-CM | POA: Diagnosis not present

## 2021-06-05 DIAGNOSIS — Z0001 Encounter for general adult medical examination with abnormal findings: Secondary | ICD-10-CM | POA: Diagnosis not present

## 2021-06-05 DIAGNOSIS — R778 Other specified abnormalities of plasma proteins: Secondary | ICD-10-CM | POA: Diagnosis not present

## 2021-07-12 ENCOUNTER — Encounter: Payer: Self-pay | Admitting: Physician Assistant

## 2021-07-12 ENCOUNTER — Ambulatory Visit: Payer: PPO | Admitting: Physician Assistant

## 2021-07-12 ENCOUNTER — Other Ambulatory Visit: Payer: Self-pay

## 2021-07-12 DIAGNOSIS — Z1283 Encounter for screening for malignant neoplasm of skin: Secondary | ICD-10-CM

## 2021-07-12 DIAGNOSIS — L820 Inflamed seborrheic keratosis: Secondary | ICD-10-CM

## 2021-07-12 DIAGNOSIS — L570 Actinic keratosis: Secondary | ICD-10-CM

## 2021-07-30 ENCOUNTER — Encounter: Payer: Self-pay | Admitting: Physician Assistant

## 2021-07-30 NOTE — Progress Notes (Signed)
   Follow-Up Visit   Subjective  Vickie Love is a 75 y.o. female who presents for the following: Follow-up (Patient wants her legs checked again dark raised places, no history of atypical moles or skin cancer).   The following portions of the chart were reviewed this encounter and updated as appropriate:  Tobacco  Allergies  Meds  Problems  Med Hx  Surg Hx  Fam Hx      Objective  Well appearing patient in no apparent distress; mood and affect are within normal limits.  A full examination was performed including scalp, head, eyes, ears, nose, lips, neck, chest, axillae, abdomen, back, buttocks, bilateral upper extremities, bilateral lower extremities, hands, feet, fingers, toes, fingernails, and toenails. All findings within normal limits unless otherwise noted below.  Mid Supratip of Nose, Right Inner Ear Erythematous patches with gritty scale.  Chest (Upper Torso, Anterior) (9), Left Arm (9), Left Dorsal Hand, Left Lower Leg - Posterior (15), Mid Back (8), Right Arm (7), Right Dorsal Hand, Right Lower Leg - Posterior (15) Erythematous stuck-on, waxy papule or plaque.         Assessment & Plan  AK (actinic keratosis) (2) Right Inner Ear; Mid Supratip of Nose  Destruction of lesion - Mid Supratip of Nose, Right Inner Ear Complexity: simple   Destruction method: cryotherapy   Informed consent: discussed and consent obtained   Timeout:  patient name, date of birth, surgical site, and procedure verified Lesion destroyed using liquid nitrogen: Yes   Cryotherapy cycles:  3 Outcome: patient tolerated procedure well with no complications    Seborrheic keratosis, inflamed Left Lower Leg - Posterior (15); Right Lower Leg - Posterior (15); Chest (Upper Torso, Anterior) (9); Mid Back (8); Left Dorsal Hand; Right Dorsal Hand; Left Arm (9); Right Arm (7)  Destruction of lesion - Chest (Upper Torso, Anterior), Left Arm, Left Dorsal Hand, Left Lower Leg - Posterior, Mid Back, Right  Arm, Right Dorsal Hand, Right Lower Leg - Posterior Complexity: simple   Destruction method: cryotherapy   Informed consent: discussed and consent obtained   Timeout:  patient name, date of birth, surgical site, and procedure verified Lesion destroyed using liquid nitrogen: Yes   Cryotherapy cycles:  3 Outcome: patient tolerated procedure well with no complications      I, Dhruv Christina, PA-C, have reviewed all documentation's for this visit.  The documentation on 07/30/21 for the exam, diagnosis, procedures and orders are all accurate and complete.

## 2021-09-17 DIAGNOSIS — Z23 Encounter for immunization: Secondary | ICD-10-CM | POA: Diagnosis not present

## 2021-12-04 DIAGNOSIS — E782 Mixed hyperlipidemia: Secondary | ICD-10-CM | POA: Diagnosis not present

## 2021-12-04 DIAGNOSIS — R7301 Impaired fasting glucose: Secondary | ICD-10-CM | POA: Diagnosis not present

## 2021-12-11 DIAGNOSIS — D72819 Decreased white blood cell count, unspecified: Secondary | ICD-10-CM | POA: Diagnosis not present

## 2021-12-11 DIAGNOSIS — E782 Mixed hyperlipidemia: Secondary | ICD-10-CM | POA: Diagnosis not present

## 2021-12-11 DIAGNOSIS — F329 Major depressive disorder, single episode, unspecified: Secondary | ICD-10-CM | POA: Diagnosis not present

## 2021-12-11 DIAGNOSIS — F411 Generalized anxiety disorder: Secondary | ICD-10-CM | POA: Diagnosis not present

## 2021-12-11 DIAGNOSIS — R7301 Impaired fasting glucose: Secondary | ICD-10-CM | POA: Diagnosis not present

## 2021-12-11 DIAGNOSIS — H04121 Dry eye syndrome of right lacrimal gland: Secondary | ICD-10-CM | POA: Diagnosis not present

## 2021-12-11 DIAGNOSIS — H04122 Dry eye syndrome of left lacrimal gland: Secondary | ICD-10-CM | POA: Diagnosis not present

## 2022-03-06 DIAGNOSIS — Z1231 Encounter for screening mammogram for malignant neoplasm of breast: Secondary | ICD-10-CM | POA: Diagnosis not present

## 2022-06-04 DIAGNOSIS — E782 Mixed hyperlipidemia: Secondary | ICD-10-CM | POA: Diagnosis not present

## 2022-06-04 DIAGNOSIS — R7301 Impaired fasting glucose: Secondary | ICD-10-CM | POA: Diagnosis not present

## 2022-06-13 DIAGNOSIS — D72819 Decreased white blood cell count, unspecified: Secondary | ICD-10-CM | POA: Diagnosis not present

## 2022-06-13 DIAGNOSIS — E782 Mixed hyperlipidemia: Secondary | ICD-10-CM | POA: Diagnosis not present

## 2022-06-13 DIAGNOSIS — H04122 Dry eye syndrome of left lacrimal gland: Secondary | ICD-10-CM | POA: Diagnosis not present

## 2022-06-13 DIAGNOSIS — F411 Generalized anxiety disorder: Secondary | ICD-10-CM | POA: Diagnosis not present

## 2022-06-13 DIAGNOSIS — F330 Major depressive disorder, recurrent, mild: Secondary | ICD-10-CM | POA: Diagnosis not present

## 2022-06-13 DIAGNOSIS — Z0001 Encounter for general adult medical examination with abnormal findings: Secondary | ICD-10-CM | POA: Diagnosis not present

## 2022-06-13 DIAGNOSIS — H04121 Dry eye syndrome of right lacrimal gland: Secondary | ICD-10-CM | POA: Diagnosis not present

## 2022-06-13 DIAGNOSIS — R7301 Impaired fasting glucose: Secondary | ICD-10-CM | POA: Diagnosis not present

## 2022-10-22 ENCOUNTER — Ambulatory Visit: Payer: PPO | Admitting: Physician Assistant

## 2022-12-09 DIAGNOSIS — M7542 Impingement syndrome of left shoulder: Secondary | ICD-10-CM | POA: Diagnosis not present

## 2022-12-11 DIAGNOSIS — E782 Mixed hyperlipidemia: Secondary | ICD-10-CM | POA: Diagnosis not present

## 2022-12-11 DIAGNOSIS — R7301 Impaired fasting glucose: Secondary | ICD-10-CM | POA: Diagnosis not present

## 2022-12-16 ENCOUNTER — Telehealth: Payer: Self-pay | Admitting: Internal Medicine

## 2022-12-16 DIAGNOSIS — H04122 Dry eye syndrome of left lacrimal gland: Secondary | ICD-10-CM | POA: Diagnosis not present

## 2022-12-16 DIAGNOSIS — E782 Mixed hyperlipidemia: Secondary | ICD-10-CM | POA: Diagnosis not present

## 2022-12-16 DIAGNOSIS — R7301 Impaired fasting glucose: Secondary | ICD-10-CM | POA: Diagnosis not present

## 2022-12-16 DIAGNOSIS — E441 Mild protein-calorie malnutrition: Secondary | ICD-10-CM | POA: Diagnosis not present

## 2022-12-16 DIAGNOSIS — H04123 Dry eye syndrome of bilateral lacrimal glands: Secondary | ICD-10-CM | POA: Diagnosis not present

## 2022-12-16 DIAGNOSIS — Z79899 Other long term (current) drug therapy: Secondary | ICD-10-CM | POA: Diagnosis not present

## 2022-12-16 DIAGNOSIS — Z0001 Encounter for general adult medical examination with abnormal findings: Secondary | ICD-10-CM | POA: Diagnosis not present

## 2022-12-16 DIAGNOSIS — F330 Major depressive disorder, recurrent, mild: Secondary | ICD-10-CM | POA: Diagnosis not present

## 2022-12-16 DIAGNOSIS — F411 Generalized anxiety disorder: Secondary | ICD-10-CM | POA: Diagnosis not present

## 2022-12-16 DIAGNOSIS — H04121 Dry eye syndrome of right lacrimal gland: Secondary | ICD-10-CM | POA: Diagnosis not present

## 2022-12-16 DIAGNOSIS — D72819 Decreased white blood cell count, unspecified: Secondary | ICD-10-CM | POA: Diagnosis not present

## 2022-12-16 NOTE — Telephone Encounter (Signed)
He does not recommend a future colonoscopy unless new symptoms develop.

## 2022-12-16 NOTE — Telephone Encounter (Signed)
This patient left a message saying it has been 5 years since her last colonoscopy.  Before that one she had one 10 years ago.  I don't see a recall for another colonoscopy and looking at previous note by Dr. Gala Romney it doesn't say when she should come back in.  Please advise.

## 2023-01-22 ENCOUNTER — Encounter: Payer: Self-pay | Admitting: *Deleted

## 2023-02-15 NOTE — Progress Notes (Unsigned)
Referring Provider: Celene Squibb, MD Primary Care Physician:  Celene Squibb, MD Primary Gastroenterologist:  Dr. Gala Romney  No chief complaint on file.   HPI:   Vickie Love is a 77 y.o. female presenting today at the request of [place] Crane, Edwinna Areola, MD for consult colonoscopy.   Last colonoscopy 07/07/2017 with one 4 mm polyp in the hepatic flexure resected and retrieved, diverticulosis in the sigmoid colon.  Pathology with tubular adenoma.  No recommendations to repeat colonoscopy due to age.  Today:    Past Medical History:  Diagnosis Date   Arthritis    Dizziness    HOH (hard of hearing)    Palpitations    PATIENT CANNOT REMEMBER    Past Surgical History:  Procedure Laterality Date   BACK SURGERY  14 yrs ago   [facility]   BREAST SURGERY     [facility] x4 at [facility]   CATARACT EXTRACTION W/PHACO  10/28/2011   Procedure: CATARACT EXTRACTION PHACO AND INTRAOCULAR LENS PLACEMENT ([place]);  Surgeon: Tonny Branch;  Location: [facility];  Service: Ophthalmology;  Laterality: Right;  CDE:13.84   COLONOSCOPY N/A 07/09/2017   Procedure: COLONOSCOPY;  Surgeon: Daneil Dolin, MD;  [facility];  Service: Endoscopy;  Laterality: N/A;  11:30 AM-rescheduled 8/22 @ 10:30 per [place]  02/2009   left eye cataract removal    KNEE ARTHROSCOPY     right   POLYPECTOMY  07/09/2017   Procedure: POLYPECTOMY;  Surgeon: Daneil Dolin, MD;  [facility];  Service: Endoscopy;;  colon   TOTAL KNEE ARTHROPLASTY Left 08/19/2016   Procedure: LEFT TOTAL KNEE ARTHROPLASTY;  Surgeon: Vickey Huger, MD;  Location: [place];  Service: Orthopedics;  Laterality: Left;   WRIST SURGERY     LEFT     Current Outpatient Medications  Medication Sig Dispense Refill   ALPRAZolam (XANAX) 1 MG tablet Take 1 mg by mouth at bedtime as needed for anxiety.     aspirin EC 81 MG tablet Take 81 mg by mouth daily.     benzonatate (TESSALON) 100 MG capsule Take 1 capsule (100 mg total) by mouth every 8 (eight) hours.  21 capsule 0   Cholecalciferol (VITAMIN D3) 2000 units capsule Take 2,000 Units by mouth daily.     cyanocobalamin 2000 MCG tablet Take 2,000 mcg by mouth daily.     ibandronate (BONIVA) 150 MG tablet Take 150 mg by mouth every 30 (thirty) days. 1st - 5th day of the month  12   polyethylene glycol-electrolytes (TRILYTE) 420 g solution Take 4,000 mLs by mouth as directed. 4000 mL 0   triamcinolone cream (KENALOG) 0.1 % Apply 1 application topically 3 (three) times daily. 15 g 0   valACYclovir (VALTREX) 1000 MG tablet Take 1 tablet (1,000 mg total) by mouth 3 (three) times daily. 21 tablet 0   No current facility-administered medications for this visit.    Allergies as of 02/17/2023 - Review Complete 07/30/2021  Allergen Reaction Noted   Latex Other (See Comments) 10/21/2011   Codeine Nausea And Vomiting 10/21/2011    Family History  Problem Relation Age of Onset   Stroke Father    Colon cancer Paternal Aunt    Anesthesia problems Neg Hx    Hypotension Neg Hx    Malignant hyperthermia Neg Hx    Pseudochol deficiency Neg Hx     Social History   Socioeconomic History   Marital status: Married    Spouse name: Not  on file   Number of children: Not on file   Years of education: Not on file   Highest education level: Not on file  Occupational History   Not on file  Tobacco Use   Smoking status: Never   Smokeless tobacco: Never  Vaping Use   Vaping Use: Never used  Substance and Sexual Activity   Alcohol use: No   Drug use: No   Sexual activity: Not on file  Other Topics Concern   Not on file  Social History Narrative   Married with 3 daughters    Social Determinants of Health   Financial Resource Strain: Not on file  Food Insecurity: Not on file  Transportation Needs: Not on file  Physical Activity: Not on file  Stress: Not on file  Social Connections: Not on file  Intimate Partner Violence: Not on file    Review of Systems: Gen: Denies any fever, chills,  fatigue, weight loss, lack of appetite.  CV: Denies chest pain, heart palpitations, peripheral edema, syncope.  Resp: Denies shortness of breath at rest or with exertion. Denies wheezing or cough.  GI: Denies dysphagia or odynophagia. Denies jaundice, hematemesis, fecal incontinence. GU : Denies urinary burning, urinary frequency, urinary hesitancy MS: Denies joint pain, muscle weakness, cramps, or limitation of movement.  Derm: Denies rash, itching, dry skin Psych: Denies depression, anxiety, memory loss, and confusion Heme: Denies bruising, bleeding, and enlarged lymph nodes.  Physical Exam: There were no vitals taken for this visit. General:   Alert and oriented. Pleasant and cooperative. Well-nourished and well-developed.  Head:  Normocephalic and atraumatic. Eyes:  Without icterus, sclera clear and conjunctiva pink.  Ears:  Normal auditory acuity. Lungs:  Clear to auscultation bilaterally. No wheezes, rales, or rhonchi. No distress.  Heart:  S1, S2 present without murmurs appreciated.  Abdomen:  +BS, soft, non-tender and non-distended. No HSM noted. No guarding or rebound. No masses appreciated.  Rectal:  Deferred  Msk:  Symmetrical without gross deformities. Normal posture. Extremities:  Without edema. Neurologic:  Alert and  oriented x4;  grossly normal neurologically. Skin:  Intact without significant lesions or rashes. Psych:  Alert and cooperative. Normal mood and affect.    Assessment:     Plan:  ***   Aliene Altes, PA-C [facility] 02/17/2023

## 2023-02-17 ENCOUNTER — Encounter: Payer: Self-pay | Admitting: Gastroenterology

## 2023-02-17 ENCOUNTER — Ambulatory Visit (INDEPENDENT_AMBULATORY_CARE_PROVIDER_SITE_OTHER): Payer: PPO | Admitting: Gastroenterology

## 2023-02-17 VITALS — BP 111/70 | HR 66 | Temp 98.2°F | Ht 66.0 in | Wt 135.2 lb

## 2023-02-17 DIAGNOSIS — Z86010 Personal history of colonic polyps: Secondary | ICD-10-CM

## 2023-02-17 NOTE — Patient Instructions (Signed)
You do not need another colonoscopy at this time unless you develop new problems such as changes in bowel habits, rectal bleeding, unintentional weight loss.  We will plan to follow-up with you as needed.  Do not hesitate to call if you develop any new GI concerns.  It was very nice to meet you today!  Aliene Altes, PA-C [facility]

## 2023-04-22 ENCOUNTER — Emergency Department (HOSPITAL_COMMUNITY)
Admission: EM | Admit: 2023-04-22 | Discharge: 2023-04-22 | Disposition: A | Discharge status: Home / Self Care | Payer: PPO | Attending: Emergency Medicine | Admitting: Emergency Medicine

## 2023-04-22 ENCOUNTER — Other Ambulatory Visit: Payer: Self-pay

## 2023-04-22 ENCOUNTER — Encounter (HOSPITAL_COMMUNITY): Payer: Self-pay

## 2023-04-22 ENCOUNTER — Emergency Department (HOSPITAL_COMMUNITY): Payer: PPO

## 2023-04-22 DIAGNOSIS — S299XXA Unspecified injury of thorax, initial encounter: Secondary | ICD-10-CM | POA: Diagnosis not present

## 2023-04-22 DIAGNOSIS — R0789 Other chest pain: Secondary | ICD-10-CM | POA: Diagnosis not present

## 2023-04-22 DIAGNOSIS — Z91040 Latex allergy status: Secondary | ICD-10-CM | POA: Insufficient documentation

## 2023-04-22 DIAGNOSIS — Y92410 Unspecified street and highway as the place of occurrence of the external cause: Secondary | ICD-10-CM | POA: Diagnosis not present

## 2023-04-22 DIAGNOSIS — R0689 Other abnormalities of breathing: Secondary | ICD-10-CM | POA: Diagnosis not present

## 2023-04-22 DIAGNOSIS — S50912A Unspecified superficial injury of left forearm, initial encounter: Secondary | ICD-10-CM | POA: Diagnosis present

## 2023-04-22 DIAGNOSIS — Z23 Encounter for immunization: Secondary | ICD-10-CM | POA: Diagnosis not present

## 2023-04-22 DIAGNOSIS — S50812A Abrasion of left forearm, initial encounter: Secondary | ICD-10-CM | POA: Diagnosis not present

## 2023-04-22 LAB — TROPONIN I (HIGH SENSITIVITY): Troponin I (High Sensitivity): 7 ng/L (ref <18)

## 2023-04-22 MED ORDER — TETANUS-DIPHTH-ACELL PERTUSSIS 5-2.5-18.5 LF-MCG/0.5 IM SUSY
0.5000 mL | PREFILLED_SYRINGE | Freq: Once | INTRAMUSCULAR | Status: AC
  Administered 2023-04-22: 0.5 mL via INTRAMUSCULAR
  Filled 2023-04-22: qty 0.5

## 2023-04-22 MED ORDER — TRIPLE ANTIBIOTIC 3.5-400-5000 EX OINT
TOPICAL_OINTMENT | Freq: Once | CUTANEOUS | Status: AC
  Administered 2023-04-22: 1 via CUTANEOUS
  Filled 2023-04-22: qty 1

## 2023-04-22 MED ORDER — IBUPROFEN 400 MG PO TABS
400.0000 mg | ORAL_TABLET | Freq: Once | ORAL | Status: AC
  Administered 2023-04-22: 400 mg via ORAL
  Filled 2023-04-22: qty 1

## 2023-04-22 MED ORDER — ACETAMINOPHEN 325 MG PO TABS
650.0000 mg | ORAL_TABLET | Freq: Once | ORAL | Status: AC
  Administered 2023-04-22: 650 mg via ORAL
  Filled 2023-04-22: qty 2

## 2023-04-22 MED ORDER — HYDROCODONE-ACETAMINOPHEN 5-325 MG PO TABS: 1.0000 | ORAL_TABLET | Freq: Four times a day (QID) | ORAL | 0 refills | Status: AC | PRN
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
## 2023-04-22 NOTE — ED Notes (Signed)
Pt updated that we are waiting on her trop level at this time.

## 2023-04-25 DIAGNOSIS — M25512 Pain in left shoulder: Secondary | ICD-10-CM | POA: Diagnosis not present

## 2023-05-06 DIAGNOSIS — M25512 Pain in left shoulder: Secondary | ICD-10-CM | POA: Diagnosis not present

## 2023-05-16 DIAGNOSIS — M25512 Pain in left shoulder: Secondary | ICD-10-CM | POA: Diagnosis not present

## 2023-05-19 DIAGNOSIS — N958 Other specified menopausal and perimenopausal disorders: Secondary | ICD-10-CM | POA: Diagnosis not present

## 2023-05-19 DIAGNOSIS — Z1231 Encounter for screening mammogram for malignant neoplasm of breast: Secondary | ICD-10-CM | POA: Diagnosis not present

## 2023-05-19 DIAGNOSIS — M8588 Other specified disorders of bone density and structure, other site: Secondary | ICD-10-CM | POA: Diagnosis not present

## 2023-05-20 DIAGNOSIS — M79602 Pain in left arm: Secondary | ICD-10-CM | POA: Diagnosis not present

## 2023-05-26 DIAGNOSIS — M79602 Pain in left arm: Secondary | ICD-10-CM | POA: Diagnosis not present

## 2023-06-05 DIAGNOSIS — N6489 Other specified disorders of breast: Secondary | ICD-10-CM | POA: Diagnosis not present

## 2023-06-05 DIAGNOSIS — M79602 Pain in left arm: Secondary | ICD-10-CM | POA: Diagnosis not present

## 2023-06-05 DIAGNOSIS — M25512 Pain in left shoulder: Secondary | ICD-10-CM | POA: Diagnosis not present

## 2023-06-05 DIAGNOSIS — R922 Inconclusive mammogram: Secondary | ICD-10-CM | POA: Diagnosis not present

## 2023-06-09 DIAGNOSIS — M25512 Pain in left shoulder: Secondary | ICD-10-CM | POA: Diagnosis not present

## 2023-06-10 DIAGNOSIS — R7301 Impaired fasting glucose: Secondary | ICD-10-CM | POA: Diagnosis not present

## 2023-06-10 DIAGNOSIS — E782 Mixed hyperlipidemia: Secondary | ICD-10-CM | POA: Diagnosis not present

## 2023-06-18 DIAGNOSIS — M25512 Pain in left shoulder: Secondary | ICD-10-CM | POA: Diagnosis not present

## 2023-06-23 DIAGNOSIS — F330 Major depressive disorder, recurrent, mild: Secondary | ICD-10-CM | POA: Diagnosis not present

## 2023-06-23 DIAGNOSIS — H04123 Dry eye syndrome of bilateral lacrimal glands: Secondary | ICD-10-CM | POA: Diagnosis not present

## 2023-06-23 DIAGNOSIS — E441 Mild protein-calorie malnutrition: Secondary | ICD-10-CM | POA: Diagnosis not present

## 2023-06-23 DIAGNOSIS — M810 Age-related osteoporosis without current pathological fracture: Secondary | ICD-10-CM | POA: Diagnosis not present

## 2023-06-23 DIAGNOSIS — R928 Other abnormal and inconclusive findings on diagnostic imaging of breast: Secondary | ICD-10-CM | POA: Diagnosis not present

## 2023-06-23 DIAGNOSIS — E782 Mixed hyperlipidemia: Secondary | ICD-10-CM | POA: Diagnosis not present

## 2023-06-23 DIAGNOSIS — Z713 Dietary counseling and surveillance: Secondary | ICD-10-CM | POA: Diagnosis not present

## 2023-06-23 DIAGNOSIS — R7301 Impaired fasting glucose: Secondary | ICD-10-CM | POA: Diagnosis not present

## 2023-06-23 DIAGNOSIS — F411 Generalized anxiety disorder: Secondary | ICD-10-CM | POA: Diagnosis not present

## 2023-06-23 DIAGNOSIS — H04121 Dry eye syndrome of right lacrimal gland: Secondary | ICD-10-CM | POA: Diagnosis not present

## 2023-06-23 DIAGNOSIS — H04122 Dry eye syndrome of left lacrimal gland: Secondary | ICD-10-CM | POA: Diagnosis not present

## 2023-06-23 DIAGNOSIS — D72819 Decreased white blood cell count, unspecified: Secondary | ICD-10-CM | POA: Diagnosis not present

## 2023-07-23 DIAGNOSIS — X32XXXD Exposure to sunlight, subsequent encounter: Secondary | ICD-10-CM | POA: Diagnosis not present

## 2023-07-23 DIAGNOSIS — L820 Inflamed seborrheic keratosis: Secondary | ICD-10-CM | POA: Diagnosis not present

## 2023-07-23 DIAGNOSIS — L821 Other seborrheic keratosis: Secondary | ICD-10-CM | POA: Diagnosis not present

## 2023-07-23 DIAGNOSIS — L570 Actinic keratosis: Secondary | ICD-10-CM | POA: Diagnosis not present

## 2023-07-23 DIAGNOSIS — L905 Scar conditions and fibrosis of skin: Secondary | ICD-10-CM | POA: Diagnosis not present

## 2023-07-31 DIAGNOSIS — M810 Age-related osteoporosis without current pathological fracture: Secondary | ICD-10-CM | POA: Diagnosis not present

## 2023-09-25 DIAGNOSIS — Z7689 Persons encountering health services in other specified circumstances: Secondary | ICD-10-CM | POA: Diagnosis not present

## 2023-10-09 DIAGNOSIS — E782 Mixed hyperlipidemia: Secondary | ICD-10-CM | POA: Diagnosis not present

## 2023-12-18 DIAGNOSIS — M810 Age-related osteoporosis without current pathological fracture: Secondary | ICD-10-CM | POA: Diagnosis not present

## 2023-12-18 DIAGNOSIS — E782 Mixed hyperlipidemia: Secondary | ICD-10-CM | POA: Diagnosis not present

## 2023-12-18 DIAGNOSIS — R7301 Impaired fasting glucose: Secondary | ICD-10-CM | POA: Diagnosis not present

## 2023-12-24 DIAGNOSIS — D72819 Decreased white blood cell count, unspecified: Secondary | ICD-10-CM | POA: Diagnosis not present

## 2023-12-24 DIAGNOSIS — H04121 Dry eye syndrome of right lacrimal gland: Secondary | ICD-10-CM | POA: Diagnosis not present

## 2023-12-24 DIAGNOSIS — R928 Other abnormal and inconclusive findings on diagnostic imaging of breast: Secondary | ICD-10-CM | POA: Diagnosis not present

## 2023-12-24 DIAGNOSIS — Z0001 Encounter for general adult medical examination with abnormal findings: Secondary | ICD-10-CM | POA: Diagnosis not present

## 2023-12-24 DIAGNOSIS — F330 Major depressive disorder, recurrent, mild: Secondary | ICD-10-CM | POA: Diagnosis not present

## 2023-12-24 DIAGNOSIS — M810 Age-related osteoporosis without current pathological fracture: Secondary | ICD-10-CM | POA: Diagnosis not present

## 2023-12-24 DIAGNOSIS — R7301 Impaired fasting glucose: Secondary | ICD-10-CM | POA: Diagnosis not present

## 2023-12-24 DIAGNOSIS — F411 Generalized anxiety disorder: Secondary | ICD-10-CM | POA: Diagnosis not present

## 2023-12-24 DIAGNOSIS — E782 Mixed hyperlipidemia: Secondary | ICD-10-CM | POA: Diagnosis not present

## 2023-12-24 DIAGNOSIS — E441 Mild protein-calorie malnutrition: Secondary | ICD-10-CM | POA: Diagnosis not present

## 2023-12-24 DIAGNOSIS — H04122 Dry eye syndrome of left lacrimal gland: Secondary | ICD-10-CM | POA: Diagnosis not present

## 2023-12-24 DIAGNOSIS — Z1283 Encounter for screening for malignant neoplasm of skin: Secondary | ICD-10-CM | POA: Diagnosis not present

## 2024-01-01 DIAGNOSIS — L568 Other specified acute skin changes due to ultraviolet radiation: Secondary | ICD-10-CM | POA: Diagnosis not present

## 2024-01-01 DIAGNOSIS — Z1283 Encounter for screening for malignant neoplasm of skin: Secondary | ICD-10-CM | POA: Diagnosis not present

## 2024-01-01 DIAGNOSIS — L570 Actinic keratosis: Secondary | ICD-10-CM | POA: Diagnosis not present

## 2024-01-15 ENCOUNTER — Ambulatory Visit
Admission: EM | Admit: 2024-01-15 | Discharge: 2024-01-15 | Disposition: A | Discharge status: Home / Self Care | Payer: HMO

## 2024-01-15 DIAGNOSIS — R062 Wheezing: Secondary | ICD-10-CM

## 2024-01-15 DIAGNOSIS — J22 Unspecified acute lower respiratory infection: Secondary | ICD-10-CM | POA: Diagnosis not present

## 2024-01-15 MED ORDER — BENZONATATE 200 MG PO CAPS: 200.0000 mg | ORAL_CAPSULE | Freq: Three times a day (TID) | ORAL | 0 refills | Status: AC | PRN

## 2024-01-15 MED ORDER — DOXYCYCLINE HYCLATE 100 MG PO CAPS: 100.0000 mg | ORAL_CAPSULE | Freq: Two times a day (BID) | ORAL | 0 refills | Status: AC

## 2024-01-15 MED ORDER — DEXAMETHASONE SODIUM PHOSPHATE 10 MG/ML IJ SOLN
10.0000 mg | Freq: Once | INTRAMUSCULAR | Status: AC
  Administered 2024-01-15: 10 mg via INTRAMUSCULAR

## 2024-01-15 NOTE — ED Provider Notes (Signed)
 [facility]    CSN: [id number] Arrival date & time: 01/15/24  1102      History   Chief Complaint No chief complaint on file.   HPI Vickie Love is a 78 y.o. female.   Patient presenting today with 1 week history of progressively worsening productive cough, wheezing, chest tightness, shortness of breath, fatigue, nasal congestion.  Denies fever, chills, chest pain, abdominal pain, vomiting, diarrhea.  So far trying Mucinex, Delsym with minimal relief.  States symptoms are significantly worsening the last day or so.  No known history of chronic pulmonary disease.  No known sick contacts recently.    Past Medical History:  Diagnosis Date   Arthritis    Dizziness    HOH (hard of hearing)    Palpitations    PATIENT CANNOT REMEMBER    Patient Active Problem List   Diagnosis Date Noted   S/P total knee replacement 08/19/2016   DIZZINESS 03/05/2010   PALPITATIONS 03/05/2010   DYSPNEA 03/05/2010   ELECTROCARDIOGRAM, ABNORMAL 03/05/2010    Past Surgical History:  Procedure Laterality Date   BACK SURGERY  14 yrs ago   [facility]   BREAST SURGERY     lumpectomies x4 at [facility]   CATARACT EXTRACTION W/PHACO  10/28/2011   Procedure: CATARACT EXTRACTION PHACO AND INTRAOCULAR LENS PLACEMENT (IOC);  Surgeon: Gemma Payor;  Location: [facility];  Service: Ophthalmology;  Laterality: Right;  CDE:13.84   COLONOSCOPY N/A 07/09/2017   Surgeon: Corbin Ade, MD; one 4 mm tubular adenoma removed, diverticulosis in the sigmoid colon.  No recommendations to repeat due to age.   EYE SURGERY  02/2009   left eye cataract removal    KNEE ARTHROSCOPY     right   POLYPECTOMY  07/09/2017   Procedure: POLYPECTOMY;  Surgeon: Corbin Ade, MD;  Location: [facility];  Service: Endoscopy;;  colon   TOTAL KNEE ARTHROPLASTY Left 08/19/2016   Procedure: LEFT TOTAL KNEE ARTHROPLASTY;  Surgeon: Dannielle Huh, MD;  Location: [facility];  Service: Orthopedics;  Laterality: Left;   WRIST SURGERY     LEFT      OB History   No obstetric history on file.      Home Medications    Prior to Admission medications   Medication Sig Start Date End Date Taking? Authorizing Provider  benzonatate (TESSALON) 200 MG capsule Take 1 capsule (200 mg total) by mouth 3 (three) times daily as needed for cough. 01/15/24  Yes Particia Nearing, PA-C  doxycycline (VIBRAMYCIN) 100 MG capsule Take 1 capsule (100 mg total) by mouth 2 (two) times daily. 01/15/24  Yes Particia Nearing, PA-C  PROLIA 60 MG/ML SOSY injection Inject 60 mg into the skin every 6 (six) months. 07/31/23  Yes [provider]  ALPRAZolam Prudy Feeler) 1 MG tablet Take 1 tablet by mouth at bedtime. 12/16/22   [provider]  ascorbic acid (VITAMIN C) 500 MG tablet Take 500 mg by mouth daily.    [provider]  BLACK ELDERBERRY PO Take by mouth.    [provider]  Cholecalciferol (VITAMIN D3) 2000 units capsule Take 2,000 Units by mouth daily.    [provider]  HYDROcodone-acetaminophen (NORCO/VICODIN) 5-325 MG tablet Take 1 tablet by mouth every 6 (six) hours as needed for severe pain. 04/22/23   Burgess Amor, PA-C  VITAMIN D-VITAMIN K PO Take by mouth.    [provider]  Zinc 100 MG TABS Take by mouth.    [provider]  Family History Family History  Problem Relation Age of Onset   Stroke Father    Colon cancer Paternal Aunt        21s   Colon cancer Paternal Aunt        13s   Colon cancer Paternal Aunt        82s   Anesthesia problems Neg Hx    Hypotension Neg Hx    Malignant hyperthermia Neg Hx    Pseudochol deficiency Neg Hx     Social History Social History   Tobacco Use   Smoking status: Never   Smokeless tobacco: Never  Vaping Use   Vaping status: Never Used  Substance Use Topics   Alcohol use: No   Drug use: No     Allergies   Latex, Pneumococcal polysaccharide vaccine, and Codeine   Review of Systems Review of Systems PER  HPI  Physical Exam Triage Vital Signs ED Triage Vitals  Encounter Vitals Group     BP 01/15/24 1127 137/83     Systolic BP Percentile --      Diastolic BP Percentile --      Pulse Rate 01/15/24 1127 75     Resp 01/15/24 1127 20     Temp 01/15/24 1127 98.2 F (36.8 C)     Temp Source 01/15/24 1127 Oral     SpO2 01/15/24 1127 92 %     Weight --      Height --      Head Circumference --      Peak Flow --      Pain Score 01/15/24 1131 0     Pain Loc --      Pain Education --      Exclude from Growth Chart --    No data found.  Updated Vital Signs BP 137/83 (BP Location: Right Arm)   Pulse 75   Temp 98.2 F (36.8 C) (Oral)   Resp 20   SpO2 92%   Visual Acuity Right Eye Distance:   Left Eye Distance:   Bilateral Distance:    Right Eye Near:   Left Eye Near:    Bilateral Near:     Physical Exam Vitals and nursing note reviewed.  Constitutional:      Appearance: Normal appearance.  HENT:     Head: Atraumatic.     Right Ear: Tympanic membrane and external ear normal.     Left Ear: Tympanic membrane and external ear normal.     Nose: Congestion present.     Mouth/Throat:     Mouth: Mucous membranes are moist.     Pharynx: Posterior oropharyngeal erythema present.  Eyes:     Extraocular Movements: Extraocular movements intact.     Conjunctiva/sclera: Conjunctivae normal.  Cardiovascular:     Rate and Rhythm: Normal rate and regular rhythm.     Heart sounds: Normal heart sounds.  Pulmonary:     Effort: Pulmonary effort is normal.     Breath sounds: Wheezing and rales present.  Musculoskeletal:        General: Normal range of motion.     Cervical back: Normal range of motion and neck supple.  Skin:    General: Skin is warm and dry.  Neurological:     Mental Status: She is alert and oriented to person, place, and time.  Psychiatric:        Mood and Affect: Mood normal.        Thought Content: Thought content normal.  UC Treatments / Results   Labs (all labs ordered are listed, but only abnormal results are displayed) Labs Reviewed - No data to display  EKG   Radiology No results found.  Procedures Procedures (including critical care time)  Medications Ordered in UC Medications  dexamethasone (DECADRON) injection 10 mg (10 mg Intramuscular Given 01/15/24 1207)    Initial Impression / Assessment and Plan / UC Course  I have reviewed the triage vital signs and the nursing notes.  Pertinent labs & imaging results that were available during my care of the patient were reviewed by me and considered in my medical decision making (see chart for details).     Given duration and worsening course of symptoms, will treat with IM Decadron, doxycycline, Tessalon, supportive over-the-counter medications and home care.  Return for worsening symptoms.  Final Clinical Impressions(s) / UC Diagnoses   Final diagnoses:  Lower respiratory infection  Wheezing     Discharge Instructions      Have given you a steroid shot today and I have sent over an antibiotic and a cough medicine to the pharmacy.  You may continue taking the Mucinex twice daily.  Drink plenty of fluids and follow-up for worsening symptoms.    ED Prescriptions     Medication Sig Dispense Auth. Provider   doxycycline (VIBRAMYCIN) 100 MG capsule Take 1 capsule (100 mg total) by mouth 2 (two) times daily. 14 capsule Particia Nearing, [place]   benzonatate (TESSALON) 200 MG capsule Take 1 capsule (200 mg total) by mouth 3 (three) times daily as needed for cough. 20 capsule Particia Nearing, [place]      PDMP not reviewed this encounter.   Particia Nearing, [place] 01/15/24 1600

## 2024-01-15 NOTE — Discharge Instructions (Signed)
 Have given you a steroid shot today and I have sent over an antibiotic and a cough medicine to the pharmacy.  You may continue taking the Mucinex twice daily.  Drink plenty of fluids and follow-up for worsening symptoms.

## 2024-01-15 NOTE — ED Triage Notes (Signed)
 Pt report cough x 1 week, has tried Mucinex and Delsym. Pt states she has has been coughing up yellow phlegm

## 2024-01-28 ENCOUNTER — Telehealth: Payer: Self-pay

## 2024-01-28 NOTE — Progress Notes (Signed)
   01/28/2024  Patient ID: Richardine Service, female   DOB: 02/08/1946, 78 y.o.   MRN: [id number]  Med Assistance  Referral from provider for med assistance with Prolia. Called [facility], she has [employer] (HMO), I wasn't able to determine what her cost for Prolia would be at an infusion center but it is >$650 at the pharmacy with her current plan.   I spoke with Dr. Margo Aye about ideal treatment, Sheilah Mins would be preferred option if covered at a reasonable rate at the infusion center. If not, she had one dose of Prolia in September 2024, tolerated it well, and in order to continue this she'd likely need to go through the infusion center for best cost.   Sent message to referral coordinator to place order for both Evenity and Prolia to determine cost. Once we determine cost we will proceed with whichever is most agreeable to patient. If patient agrees to neither we will likely start alendronate weekly.  Fayette Pho, PharmD

## 2024-02-04 DIAGNOSIS — R009 Unspecified abnormalities of heart beat: Secondary | ICD-10-CM | POA: Diagnosis not present

## 2024-03-16 ENCOUNTER — Other Ambulatory Visit: Payer: Self-pay

## 2024-03-16 ENCOUNTER — Telehealth: Payer: Self-pay

## 2024-03-16 DIAGNOSIS — M810 Age-related osteoporosis without current pathological fracture: Secondary | ICD-10-CM | POA: Insufficient documentation

## 2024-03-16 NOTE — Telephone Encounter (Signed)
 Auth Submission: NO AUTH NEEDED Site of care: Site of care: [facility] Payer: [employer] Medication & CPT/J Code(s) submitted: Prolia (Denosumab) [id number] Route of submission (phone, fax, portal): phone Phone # Fax # Auth type: Buy/Bill PB Units/visits requested: 60mg , q86months x2doses Reference number: [id number] Approval from: 03/16/24 to 11/17/24   Pt is in network with our facility REF# [id number]

## 2024-04-13 DIAGNOSIS — L568 Other specified acute skin changes due to ultraviolet radiation: Secondary | ICD-10-CM | POA: Diagnosis not present

## 2024-04-13 DIAGNOSIS — L570 Actinic keratosis: Secondary | ICD-10-CM | POA: Diagnosis not present

## 2024-05-25 DIAGNOSIS — Z1231 Encounter for screening mammogram for malignant neoplasm of breast: Secondary | ICD-10-CM | POA: Diagnosis not present

## 2024-06-17 DIAGNOSIS — R7301 Impaired fasting glucose: Secondary | ICD-10-CM | POA: Diagnosis not present

## 2024-06-17 DIAGNOSIS — E782 Mixed hyperlipidemia: Secondary | ICD-10-CM | POA: Diagnosis not present

## 2024-06-17 DIAGNOSIS — M810 Age-related osteoporosis without current pathological fracture: Secondary | ICD-10-CM | POA: Diagnosis not present

## 2024-06-23 DIAGNOSIS — E782 Mixed hyperlipidemia: Secondary | ICD-10-CM | POA: Diagnosis not present

## 2024-06-23 DIAGNOSIS — H04122 Dry eye syndrome of left lacrimal gland: Secondary | ICD-10-CM | POA: Diagnosis not present

## 2024-06-23 DIAGNOSIS — F411 Generalized anxiety disorder: Secondary | ICD-10-CM | POA: Diagnosis not present

## 2024-06-23 DIAGNOSIS — R7301 Impaired fasting glucose: Secondary | ICD-10-CM | POA: Diagnosis not present

## 2024-06-23 DIAGNOSIS — H04121 Dry eye syndrome of right lacrimal gland: Secondary | ICD-10-CM | POA: Diagnosis not present

## 2024-06-23 DIAGNOSIS — D72819 Decreased white blood cell count, unspecified: Secondary | ICD-10-CM | POA: Diagnosis not present

## 2024-06-23 DIAGNOSIS — R928 Other abnormal and inconclusive findings on diagnostic imaging of breast: Secondary | ICD-10-CM | POA: Diagnosis not present

## 2024-06-23 DIAGNOSIS — R202 Paresthesia of skin: Secondary | ICD-10-CM | POA: Diagnosis not present

## 2024-06-23 DIAGNOSIS — E441 Mild protein-calorie malnutrition: Secondary | ICD-10-CM | POA: Diagnosis not present

## 2024-06-23 DIAGNOSIS — M810 Age-related osteoporosis without current pathological fracture: Secondary | ICD-10-CM | POA: Diagnosis not present

## 2024-06-23 DIAGNOSIS — D696 Thrombocytopenia, unspecified: Secondary | ICD-10-CM | POA: Diagnosis not present

## 2024-06-23 DIAGNOSIS — F330 Major depressive disorder, recurrent, mild: Secondary | ICD-10-CM | POA: Diagnosis not present

## 2024-08-24 DIAGNOSIS — H16223 Keratoconjunctivitis sicca, not specified as Sjogren's, bilateral: Secondary | ICD-10-CM | POA: Diagnosis not present

## 2024-08-24 DIAGNOSIS — H353132 Nonexudative age-related macular degeneration, bilateral, intermediate dry stage: Secondary | ICD-10-CM | POA: Diagnosis not present

## 2024-08-24 DIAGNOSIS — Z961 Presence of intraocular lens: Secondary | ICD-10-CM | POA: Diagnosis not present

## 2024-09-30 DIAGNOSIS — H01001 Unspecified blepharitis right upper eyelid: Secondary | ICD-10-CM | POA: Diagnosis not present

## 2024-09-30 DIAGNOSIS — H01004 Unspecified blepharitis left upper eyelid: Secondary | ICD-10-CM | POA: Diagnosis not present

## 2024-09-30 DIAGNOSIS — H01002 Unspecified blepharitis right lower eyelid: Secondary | ICD-10-CM | POA: Diagnosis not present

## 2024-09-30 DIAGNOSIS — H01005 Unspecified blepharitis left lower eyelid: Secondary | ICD-10-CM | POA: Diagnosis not present

## 2024-09-30 DIAGNOSIS — H00014 Hordeolum externum left upper eyelid: Secondary | ICD-10-CM | POA: Diagnosis not present

## 2024-10-05 DIAGNOSIS — H01004 Unspecified blepharitis left upper eyelid: Secondary | ICD-10-CM | POA: Diagnosis not present

## 2024-10-05 DIAGNOSIS — H01001 Unspecified blepharitis right upper eyelid: Secondary | ICD-10-CM | POA: Diagnosis not present

## 2024-10-05 DIAGNOSIS — H00014 Hordeolum externum left upper eyelid: Secondary | ICD-10-CM | POA: Diagnosis not present

## 2024-10-05 DIAGNOSIS — H01005 Unspecified blepharitis left lower eyelid: Secondary | ICD-10-CM | POA: Diagnosis not present

## 2024-10-05 DIAGNOSIS — H01002 Unspecified blepharitis right lower eyelid: Secondary | ICD-10-CM | POA: Diagnosis not present
